# Patient Record
Sex: Female | Born: 1995
Health system: Southern US, Community
[De-identification: ages and names within clinical notes are randomized; demographics above are authoritative.]

## PROBLEM LIST (undated history)

## (undated) DIAGNOSIS — Z789 Other specified health status: Secondary | ICD-10-CM

## (undated) HISTORY — DX: Other specified health status: Z78.9

---
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
## 2018-03-02 ENCOUNTER — Other Ambulatory Visit: Payer: Self-pay | Admitting: Gastroenterology

## 2018-03-07 ENCOUNTER — Ambulatory Visit (HOSPITAL_COMMUNITY)
Admission: RE | Admit: 2018-03-07 | Discharge: 2018-03-07 | Disposition: A | Discharge status: Home / Self Care | Payer: BLUE CROSS/BLUE SHIELD | Source: Ambulatory Visit | Attending: Gastroenterology | Admitting: Gastroenterology

## 2018-03-07 ENCOUNTER — Encounter (HOSPITAL_COMMUNITY)
Admission: RE | Disposition: A | Discharge status: Home / Self Care | Payer: Self-pay | Source: Ambulatory Visit | Attending: Gastroenterology

## 2018-03-07 ENCOUNTER — Encounter (HOSPITAL_COMMUNITY): Payer: Self-pay | Admitting: *Deleted

## 2018-03-07 DIAGNOSIS — R1310 Dysphagia, unspecified: Secondary | ICD-10-CM | POA: Insufficient documentation

## 2018-03-07 DIAGNOSIS — K219 Gastro-esophageal reflux disease without esophagitis: Secondary | ICD-10-CM | POA: Insufficient documentation

## 2018-03-07 HISTORY — PX: ESOPHAGEAL MANOMETRY: SHX5429

## 2018-03-07 SURGERY — MANOMETRY, ESOPHAGUS

## 2018-03-07 MED ORDER — LIDOCAINE VISCOUS 2 % MT SOLN
OROMUCOSAL | Status: AC
  Filled 2018-03-07: qty 15

## 2018-03-07 SURGICAL SUPPLY — 2 items
FACESHIELD LNG OPTICON STERILE (SAFETY) IMPLANT
GLOVE BIO SURGEON STRL SZ8 (GLOVE) ×4 IMPLANT

## 2018-03-07 NOTE — H&P (Signed)
  Susan Maxcyaliyah K Lara HPI: She complaints about persistent dysphagia with a negative EGD and no response with Dexilant.  No past medical history on file.    No family history on file.  Social History:  has no tobacco, alcohol, and drug history on file.  Allergies: Not on File  Medications: Scheduled: Continuous:  No results found for this or any previous visit (from the past 24 hour(s)).   No results found.  ROS:  As stated above in the HPI otherwise negative.  Height 5\' 2"  (1.575 m), weight 40.8 kg (90 lb).    PE: Nursing procedure.  Assessment/Plan: 1) Dysphagia - Manometry  Malya Cirillo D 03/07/2018, 5:42 PM

## 2018-03-07 NOTE — Progress Notes (Signed)
Esophageal manometry done per protocol.  Patient tolerated well.  

## 2018-06-29 ENCOUNTER — Telehealth: Payer: Self-pay

## 2018-06-29 NOTE — Telephone Encounter (Signed)
error 

## 2018-10-14 ENCOUNTER — Ambulatory Visit (HOSPITAL_COMMUNITY): Payer: Self-pay | Admitting: Psychiatry

## 2018-10-26 ENCOUNTER — Ambulatory Visit (HOSPITAL_COMMUNITY): Payer: Self-pay | Admitting: Psychiatry

## 2018-11-27 ENCOUNTER — Encounter (HOSPITAL_COMMUNITY): Payer: Self-pay | Admitting: Emergency Medicine

## 2018-11-27 ENCOUNTER — Emergency Department (HOSPITAL_COMMUNITY)
Admission: EM | Admit: 2018-11-27 | Discharge: 2018-11-27 | Disposition: A | Discharge status: Home / Self Care | Payer: BLUE CROSS/BLUE SHIELD | Attending: Emergency Medicine | Admitting: Emergency Medicine

## 2018-11-27 DIAGNOSIS — R110 Nausea: Secondary | ICD-10-CM | POA: Insufficient documentation

## 2018-11-27 DIAGNOSIS — R0602 Shortness of breath: Secondary | ICD-10-CM | POA: Insufficient documentation

## 2018-11-27 DIAGNOSIS — R42 Dizziness and giddiness: Secondary | ICD-10-CM | POA: Diagnosis not present

## 2018-11-27 DIAGNOSIS — R519 Headache, unspecified: Secondary | ICD-10-CM

## 2018-11-27 DIAGNOSIS — M791 Myalgia, unspecified site: Secondary | ICD-10-CM | POA: Insufficient documentation

## 2018-11-27 DIAGNOSIS — Z79899 Other long term (current) drug therapy: Secondary | ICD-10-CM | POA: Diagnosis not present

## 2018-11-27 DIAGNOSIS — R51 Headache: Secondary | ICD-10-CM | POA: Insufficient documentation

## 2018-11-27 LAB — COMPREHENSIVE METABOLIC PANEL
ALT: 10 U/L (ref 0–44)
AST: 21 U/L (ref 15–41)
Albumin: 4.5 g/dL (ref 3.5–5.0)
Alkaline Phosphatase: 61 U/L (ref 38–126)
Anion gap: 12 (ref 5–15)
BUN: 17 mg/dL (ref 6–20)
CO2: 24 mmol/L (ref 22–32)
Calcium: 9.3 mg/dL (ref 8.9–10.3)
Chloride: 106 mmol/L (ref 98–111)
Creatinine, Ser: 0.84 mg/dL (ref 0.44–1.00)
GFR calc Af Amer: >60 mL/min (ref >60)
GFR calc non Af Amer: >60 mL/min (ref >60)
Glucose, Bld: 87 mg/dL (ref 70–99)
Potassium: 3.4 mmol/L — ABNORMAL LOW (ref 3.5–5.1)
Sodium: 142 mmol/L (ref 135–145)
Total Bilirubin: 2.7 mg/dL — ABNORMAL HIGH (ref 0.3–1.2)
Total Protein: 7.6 g/dL (ref 6.5–8.1)

## 2018-11-27 LAB — CBC WITH DIFFERENTIAL/PLATELET
Abs Immature Granulocytes: 0.02 10*3/uL (ref 0.00–0.07)
Basophils Absolute: 0.1 10*3/uL (ref 0.0–0.1)
Basophils Relative: 1 %
Eosinophils Absolute: 0.1 10*3/uL (ref 0.0–0.5)
Eosinophils Relative: 1 %
HCT: 43.7 % (ref 36.0–46.0)
Hemoglobin: 13.7 g/dL (ref 12.0–15.0)
Immature Granulocytes: 0 %
Lymphocytes Relative: 21 %
Lymphs Abs: 1.4 10*3/uL (ref 0.7–4.0)
MCH: 28.4 pg (ref 26.0–34.0)
MCHC: 31.4 g/dL (ref 30.0–36.0)
MCV: 90.5 fL (ref 80.0–100.0)
Monocytes Absolute: 0.5 10*3/uL (ref 0.1–1.0)
Monocytes Relative: 7 %
Neutro Abs: 4.6 10*3/uL (ref 1.7–7.7)
Neutrophils Relative %: 70 %
Platelets: 265 10*3/uL (ref 150–400)
RBC: 4.83 MIL/uL (ref 3.87–5.11)
RDW: 13.2 % (ref 11.5–15.5)
WBC: 6.6 10*3/uL (ref 4.0–10.5)
nRBC: 0 % (ref 0.0–0.2)

## 2018-11-27 LAB — RAPID URINE DRUG SCREEN, HOSP PERFORMED
Amphetamines: NOT DETECTED
Barbiturates: NOT DETECTED
Benzodiazepines: NOT DETECTED
Cocaine: NOT DETECTED
Opiates: NOT DETECTED
Tetrahydrocannabinol: NOT DETECTED

## 2018-11-27 LAB — CK: Total CK: 110 U/L (ref 38–234)

## 2018-11-27 LAB — TSH: TSH: 1.101 u[IU]/mL (ref 0.350–4.500)

## 2018-11-27 LAB — POC URINE PREG, ED: Preg Test, Ur: NEGATIVE

## 2018-11-27 MED ORDER — POTASSIUM CHLORIDE CRYS ER 20 MEQ PO TBCR
40.0000 meq | EXTENDED_RELEASE_TABLET | Freq: Once | ORAL | Status: AC
  Administered 2018-11-27: 40 meq via ORAL
  Filled 2018-11-27: qty 2

## 2018-11-27 MED ORDER — SODIUM CHLORIDE 0.9 % IV BOLUS
1000.0000 mL | Freq: Once | INTRAVENOUS | Status: AC
  Administered 2018-11-27: 1000 mL via INTRAVENOUS

## 2018-11-27 NOTE — ED Triage Notes (Signed)
Pt c/o headaches, body aches, dizziness for couple weeks. Denies n/v but reports light sensitivity.

## 2018-11-27 NOTE — Discharge Instructions (Addendum)
If you develop worsening headache, neck stiffness, fever, vomiting, double vision or consistent blurry vision, weakness or numbness in your arms or legs, or any other new/concerning symptoms then return to the ER for evaluation.
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
## 2018-11-27 NOTE — ED Provider Notes (Signed)
[facility]-EMERGENCY DEPT Provider Note   CSN: [id number] Arrival date & time: 11/27/18  4098     History   Chief Complaint Chief Complaint  Patient presents with  . Headache  . Dizziness  . Generalized Body Aches    HPI Susan Lara is a 22 y.o. female.  HPI  22 year old female presents with multiple complaints.  For about a week or so she has been having headaches.  Seem to come and go.  Often they are present in the morning and then progressively worsen.  Seem to get better and then come back at night and she has a hard time sleeping.  She has not had any vomiting but some nausea.  She is also been having some body aches that are mostly in her bilateral legs.  She states that she does have some blurry vision when she stares at something for too long.  However generally is not having any blurry vision or double vision.  The headaches are general throughout her entire head and seem to be throbbing.  Currently the headache is pretty mild.  She is also been having dizziness/lightheadedness for a couple weeks.  Saw her doctor about a week ago and her blood pressure was 70 systolic.  She feels overall weak, especially first thing in the morning.  Her last menstrual cycle was about 2 weeks ago.  There is no diarrhea, chest pain, cough.  She feels a little short of breath at times though nothing sets it off.  She thinks she might of lost a little bit of weight recently but that is a fluctuating problem for her overall.  History reviewed. No pertinent past medical history.  There are no active problems to display for this patient.   Past Surgical History:  Procedure Laterality Date  . ESOPHAGEAL MANOMETRY N/A 03/07/2018   Procedure: ESOPHAGEAL MANOMETRY (EM);  Surgeon: Jeani Hawking, MD;  Location: [facility];  Service: Endoscopy;  Laterality: N/A;  . SINUS EXPLORATION  2011     OB History   No obstetric history on file.      Home Medications    Prior  to Admission medications   Medication Sig Start Date End Date Taking? Authorizing Provider  ARIPiprazole (ABILIFY) 10 MG tablet Take 10 mg by mouth at bedtime.   Yes [provider]    Family History No family history on file.  Social History Social History   Tobacco Use  . Smoking status: Never Smoker  . Smokeless tobacco: Never Used  Substance Use Topics  . Alcohol use: Not Currently  . Drug use: Not on file     Allergies   Patient has no known allergies.   Review of Systems Review of Systems  Constitutional: Negative for fever.  Eyes: Positive for visual disturbance.  Respiratory: Positive for shortness of breath. Negative for cough.   Cardiovascular: Negative for chest pain.  Gastrointestinal: Positive for nausea. Negative for abdominal pain, diarrhea and vomiting.  Musculoskeletal: Negative for neck pain.  Neurological: Positive for dizziness, light-headedness and headaches. Negative for weakness and numbness.  All other systems reviewed and are negative.    Physical Exam Updated Vital Signs BP 97/70 (BP Location: Left Arm)   Pulse 87   Temp 98 F (36.7 C)   Resp 18   LMP 11/13/2018   SpO2 100%   Physical Exam Vitals signs and nursing note reviewed.  Constitutional:      Appearance: She is well-developed.  HENT:  Head: Normocephalic and atraumatic.     Right Ear: External ear normal.     Left Ear: External ear normal.     Nose: Nose normal.  Eyes:     General:        Right eye: No discharge.        Left eye: No discharge.     Extraocular Movements: Extraocular movements intact.     Pupils: Pupils are equal, round, and reactive to light.  Neck:     Musculoskeletal: Normal range of motion and neck supple.  Cardiovascular:     Rate and Rhythm: Normal rate and regular rhythm.     Heart sounds: Normal heart sounds.  Pulmonary:     Effort: Pulmonary effort is normal.     Breath sounds: Normal breath sounds.  Abdominal:     Palpations:  Abdomen is soft.     Tenderness: There is no abdominal tenderness.  Skin:    General: Skin is warm and dry.  Neurological:     Mental Status: She is alert and oriented to person, place, and time.     Comments: CN 3-12 grossly intact. 5/5 strength in all 4 extremities. Grossly normal sensation. Normal finger to nose.   Psychiatric:        Mood and Affect: Mood is not anxious.      ED Treatments / Results  Labs (all labs ordered are listed, but only abnormal results are displayed) Labs Reviewed  COMPREHENSIVE METABOLIC PANEL - Abnormal; Notable for the following components:      Result Value   Potassium 3.4 (*)    Total Bilirubin 2.7 (*)    All other components within normal limits  CBC WITH DIFFERENTIAL/PLATELET  TSH  CK  RAPID URINE DRUG SCREEN, HOSP PERFORMED  POC URINE PREG, ED    EKG EKG Interpretation  Date/Time:  Sunday November 27 2018 09:54:08 EST Ventricular Rate:  74 PR Interval:    QRS Duration: 88 QT Interval:  390 QTC Calculation: 433 R Axis:   91 Text Interpretation:  Sinus rhythm Borderline right axis deviation no acute ST/T changes No old tracing to compare Confirmed by Pricilla LovelessGoldston, Quindon Denker [phone]) on 11/27/2018 10:22:04 AM Also confirmed by Pricilla LovelessGoldston, Valena Ivanov [phone]), editor Barbette Hairassel, Kerry [phone])  on 11/27/2018 12:28:22 PM   Radiology No results found.  Procedures Procedures (including critical care time)  Medications Ordered in ED Medications  sodium chloride 0.9 % bolus 1,000 mL (0 mLs Intravenous Stopped 11/27/18 1155)  potassium chloride SA (K-DUR,KLOR-CON) CR tablet 40 mEq (40 mEq Oral Given 11/27/18 1152)     Initial Impression / Assessment and Plan / ED Course  I have reviewed the triage vital signs and the nursing notes.  Pertinent labs & imaging results that were available during my care of the patient were reviewed by me and considered in my medical decision making (see chart for details).     Patient with multiple vague complaints.   While headache is 1 of them, she has a benign neurologic exam.  She does endorse some blurry vision but has 20/20 vision bilaterally.  There is no double vision.  Headache is sometimes worse in the morning but also worse at night and there is no emesis in the a.m.  I doubt CNS mass, infection, etc.  I do not think an emergent CT is warranted though she may need an outpatient MRI if the symptoms continue.  Given the low blood pressure at her doctor's office when this first started she may be  having low blood pressure causing symptoms such as dizziness and headache.  I have advised her to check her blood pressure when the symptoms occur.  Right now she is feeling fine.  Otherwise screening lab work is pretty benign besides minimal hypokalemia.  The mildly elevated bilirubin is of unclear etiology as she has no abdominal pain but I think overall she is stable for discharge to follow-up with her PCP.  We discussed return precautions.  Final Clinical Impressions(s) / ED Diagnoses   Final diagnoses:  Aching headache    ED Discharge Orders    None       Pricilla LovelessGoldston, Jadesola Poynter, MD 11/27/18 1529

## 2019-11-22 ENCOUNTER — Other Ambulatory Visit: Payer: Self-pay

## 2019-11-22 ENCOUNTER — Ambulatory Visit (INDEPENDENT_AMBULATORY_CARE_PROVIDER_SITE_OTHER): Payer: BC Managed Care – PPO | Admitting: Adult Health

## 2019-11-22 ENCOUNTER — Encounter: Payer: Self-pay | Admitting: Adult Health

## 2019-11-22 VITALS — BP 97/61 | HR 89 | Ht 63.0 in | Wt 104.8 lb

## 2019-11-22 DIAGNOSIS — Z3A01 Less than 8 weeks gestation of pregnancy: Secondary | ICD-10-CM | POA: Insufficient documentation

## 2019-11-22 DIAGNOSIS — O3680X0 Pregnancy with inconclusive fetal viability, not applicable or unspecified: Secondary | ICD-10-CM | POA: Diagnosis not present

## 2019-11-22 DIAGNOSIS — R350 Frequency of micturition: Secondary | ICD-10-CM | POA: Insufficient documentation

## 2019-11-22 DIAGNOSIS — Z3201 Encounter for pregnancy test, result positive: Secondary | ICD-10-CM | POA: Insufficient documentation

## 2019-11-22 LAB — POCT URINALYSIS DIPSTICK OB
Blood, UA: NEGATIVE
Glucose, UA: NEGATIVE
Nitrite, UA: NEGATIVE

## 2019-11-22 LAB — POCT URINE PREGNANCY: Preg Test, Ur: POSITIVE — AB

## 2019-11-22 MED ORDER — PRENATAL PLUS IRON 29-1 MG PO TABS: 1.0000 | ORAL_TABLET | Freq: Every day | ORAL | 12 refills | Status: DC

## 2019-11-22 NOTE — Progress Notes (Signed)
  Subjective:     Patient ID: Susan Lara, female   DOB: March 24, 1996, 23 y.o.   MRN: [id number]  HPI Susan Lara is a 23 year old black female, single, new to this practice in for a UPT.Has missed a period and had 1+HPT. Had thought about being seen in [place] but they could not see til January. She was delivered by [facility]   Review of Systems +missed period +HPT +urinary frequency and cramping Denies any bleeding +sleepy     Objective:   Physical Exam BP 97/61 (BP Location: Left Arm, Patient Position: Sitting, Cuff Size: Normal)   Pulse 89   Ht 5\' 3"  (1.6 m)   Wt 104 lb 12.8 oz (47.5 kg)   LMP 10/17/2019 (Exact Date)   BMI 18.56 kg/m urine dipstick, trace protein,leuks and moderate ketones UPT is +, about 5+1 week by LMP with EDD 07/23/20.   Skin warm and dry. Neck: mid line trachea, normal thyroid, good ROM, no lymphadenopathy noted. Lungs: clear to ausculation bilaterally. Cardiovascular: regular rate and rhythm. Abdomen is soft and non tender Fall risk is low PHQ 2 score is 0.  Pt aware deliveries at [facility] and about after hours call service.  Discussed early symptoms of pregnancy     Assessment:     1. Positive urine pregnancy test   2. Frequent urination   3. Less than [redacted] weeks gestation of pregnancy   4. Encounter to determine fetal viability of pregnancy, single or unspecified fetus       Plan:     Meds ordered this encounter  Medications  . Prenatal Vit-Iron Carbonyl-FA (PRENATAL PLUS IRON) 29-1 MG TABS    Sig: Take 1 tablet by mouth daily.    Dispense:  30 tablet    Refill:  12    Order Specific Question:   Supervising Provider    Answer:   Florian Buff [[id number]]  Return in 3 weeks for dating [place] Review handouts on First trimester and by [facility]

## 2019-11-22 NOTE — Patient Instructions (Signed)
First Trimester of Pregnancy The first trimester of pregnancy is from week 1 until the end of week 13 (months 1 through 3). A week after a sperm fertilizes an egg, the egg will implant on the wall of the uterus. This embryo will begin to develop into a baby. Genes from you and your partner will form the baby. The female genes will determine whether the baby will be a boy or a girl. At 6-8 weeks, the eyes and face will be formed, and the heartbeat can be seen on ultrasound. At the end of 12 weeks, all the baby's organs will be formed. Now that you are pregnant, you will want to do everything you can to have a healthy baby. Two of the most important things are to get good prenatal care and to follow your health care provider's instructions. Prenatal care is all the medical care you receive before the baby's birth. This care will help prevent, find, and treat any problems during the pregnancy and childbirth. Body changes during your first trimester Your body goes through many changes during pregnancy. The changes vary from woman to woman.  You may gain or lose a couple of pounds at first.  You may feel sick to your stomach (nauseous) and you may throw up (vomit). If the vomiting is uncontrollable, call your health care provider.  You may tire easily.  You may develop headaches that can be relieved by medicines. All medicines should be approved by your health care provider.  You may urinate more often. Painful urination may mean you have a bladder infection.  You may develop heartburn as a result of your pregnancy.  You may develop constipation because certain hormones are causing the muscles that push stool through your intestines to slow down.  You may develop hemorrhoids or swollen veins (varicose veins).  Your breasts may begin to grow larger and become tender. Your nipples may stick out more, and the tissue that surrounds them (areola) may become darker.  Your gums may bleed and may be  sensitive to brushing and flossing.  Dark spots or blotches (chloasma, mask of pregnancy) may develop on your face. This will likely fade after the baby is born.  Your menstrual periods will stop.  You may have a loss of appetite.  You may develop cravings for certain kinds of food.  You may have changes in your emotions from day to day, such as being excited to be pregnant or being concerned that something may go wrong with the pregnancy and baby.  You may have more vivid and strange dreams.  You may have changes in your hair. These can include thickening of your hair, rapid growth, and changes in texture. Some women also have hair loss during or after pregnancy, or hair that feels dry or thin. Your hair will most likely return to normal after your baby is born. What to expect at prenatal visits During a routine prenatal visit:  You will be weighed to make sure you and the baby are growing normally.  Your blood pressure will be taken.  Your abdomen will be measured to track your baby's growth.  The fetal heartbeat will be listened to between weeks 10 and 14 of your pregnancy.  Test results from any previous visits will be discussed. Your health care provider may ask you:  How you are feeling.  If you are feeling the baby move.  If you have had any abnormal symptoms, such as leaking fluid, bleeding, severe headaches, or abdominal   cramping.  If you are using any tobacco products, including cigarettes, chewing tobacco, and electronic cigarettes.  If you have any questions. Other tests that may be performed during your first trimester include:  Blood tests to find your blood type and to check for the presence of any previous infections. The tests will also be used to check for low iron levels (anemia) and protein on red blood cells (Rh antibodies). Depending on your risk factors, or if you previously had diabetes during pregnancy, you may have tests to check for high blood sugar  that affects pregnant women (gestational diabetes).  Urine tests to check for infections, diabetes, or protein in the urine.  An ultrasound to confirm the proper growth and development of the baby.  Fetal screens for spinal cord problems (spina bifida) and Down syndrome.  HIV (human immunodeficiency virus) testing. Routine prenatal testing includes screening for HIV, unless you choose not to have this test.  You may need other tests to make sure you and the baby are doing well. Follow these instructions at home: Medicines  Follow your health care provider's instructions regarding medicine use. Specific medicines may be either safe or unsafe to take during pregnancy.  Take a prenatal vitamin that contains at least 600 micrograms (mcg) of folic acid.  If you develop constipation, try taking a stool softener if your health care provider approves. Eating and drinking   Eat a balanced diet that includes fresh fruits and vegetables, whole grains, good sources of protein such as meat, eggs, or tofu, and low-fat dairy. Your health care provider will help you determine the amount of weight gain that is right for you.  Avoid raw meat and uncooked cheese. These carry germs that can cause birth defects in the baby.  Eating four or five small meals rather than three large meals a day may help relieve nausea and vomiting. If you start to feel nauseous, eating a few soda crackers can be helpful. Drinking liquids between meals, instead of during meals, also seems to help ease nausea and vomiting.  Limit foods that are high in fat and processed sugars, such as fried and sweet foods.  To prevent constipation: ? Eat foods that are high in fiber, such as fresh fruits and vegetables, whole grains, and beans. ? Drink enough fluid to keep your urine clear or pale yellow. Activity  Exercise only as directed by your health care provider. Most women can continue their usual exercise routine during  pregnancy. Try to exercise for 30 minutes at least 5 days a week. Exercising will help you: ? Control your weight. ? Stay in shape. ? Be prepared for labor and delivery.  Experiencing pain or cramping in the lower abdomen or lower back is a good sign that you should stop exercising. Check with your health care provider before continuing with normal exercises.  Try to avoid standing for long periods of time. Move your legs often if you must stand in one place for a long time.  Avoid heavy lifting.  Wear low-heeled shoes and practice good posture.  You may continue to have sex unless your health care provider tells you not to. Relieving pain and discomfort  Wear a good support bra to relieve breast tenderness.  Take warm sitz baths to soothe any pain or discomfort caused by hemorrhoids. Use hemorrhoid cream if your health care provider approves.  Rest with your legs elevated if you have leg cramps or low back pain.  If you develop varicose veins in   your legs, wear support hose. Elevate your feet for 15 minutes, 3-4 times a day. Limit salt in your diet. Prenatal care  Schedule your prenatal visits by the twelfth week of pregnancy. They are usually scheduled monthly at first, then more often in the last 2 months before delivery.  Write down your questions. Take them to your prenatal visits.  Keep all your prenatal visits as told by your health care provider. This is important. Safety  Wear your seat belt at all times when driving.  Make a list of emergency phone numbers, including numbers for family, friends, the hospital, and police and fire departments. General instructions  Ask your health care provider for a referral to a local prenatal education class. Begin classes no later than the beginning of month 6 of your pregnancy.  Ask for help if you have counseling or nutritional needs during pregnancy. Your health care provider can offer advice or refer you to specialists for help  with various needs.  Do not use hot tubs, steam rooms, or saunas.  Do not douche or use tampons or scented sanitary pads.  Do not cross your legs for long periods of time.  Avoid cat litter boxes and soil used by cats. These carry germs that can cause birth defects in the baby and possibly loss of the fetus by miscarriage or stillbirth.  Avoid all smoking, herbs, alcohol, and medicines not prescribed by your health care provider. Chemicals in these products affect the formation and growth of the baby.  Do not use any products that contain nicotine or tobacco, such as cigarettes and e-cigarettes. If you need help quitting, ask your health care provider. You may receive counseling support and other resources to help you quit.  Schedule a dentist appointment. At home, brush your teeth with a soft toothbrush and be gentle when you floss. Contact a health care provider if:  You have dizziness.  You have mild pelvic cramps, pelvic pressure, or nagging pain in the abdominal area.  You have persistent nausea, vomiting, or diarrhea.  You have a bad smelling vaginal discharge.  You have pain when you urinate.  You notice increased swelling in your face, hands, legs, or ankles.  You are exposed to fifth disease or chickenpox.  You are exposed to German measles (rubella) and have never had it. Get help right away if:  You have a fever.  You are leaking fluid from your vagina.  You have spotting or bleeding from your vagina.  You have severe abdominal cramping or pain.  You have rapid weight gain or loss.  You vomit blood or material that looks like coffee grounds.  You develop a severe headache.  You have shortness of breath.  You have any kind of trauma, such as from a fall or a car accident. Summary  The first trimester of pregnancy is from week 1 until the end of week 13 (months 1 through 3).  Your body goes through many changes during pregnancy. The changes vary from  woman to woman.  You will have routine prenatal visits. During those visits, your health care provider will examine you, discuss any test results you may have, and talk with you about how you are feeling. This information is not intended to replace advice given to you by your health care provider. Make sure you discuss any questions you have with your health care provider. Document Released: 11/17/2001 Document Revised: 11/05/2017 Document Reviewed: 11/04/2016 Elsevier Patient Education  2020 [employer]  

## 2019-11-24 ENCOUNTER — Telehealth: Payer: Self-pay | Admitting: *Deleted

## 2019-11-24 NOTE — Telephone Encounter (Signed)
[name] from [facility]) called regarding prenatal prescription. Original prescription was for 29 mg of iron and they have the vitamin with 27 mg of iron. I advised 27 mg of iron is fine. Pt aware. [place]

## 2019-11-24 NOTE — Telephone Encounter (Signed)
Pt didn't think pharmacy had her prenatal vit but pt was advised prenatal plus was sent to her pharmacy on 11/22/19. Pt voiced understanding. [place]

## 2019-12-08 NOTE — L&D Delivery Note (Signed)
[facility] Delivery Note  Susan Lara is a 24 y.o. G1P1001 s/p NSVD at [redacted]w[redacted]d. She was admitted for SROM.   ROM: 17h 32m with clear fluid GBS Status: neg  Maximum Maternal Temperature: 98.3   Labor Progress: . Pt presented with SROM at 2000 last night and progressed after Pitocin augmentation to complete/+2. She labored down to 0 station for before attempting pushing.  Delivery Date/Time: 07/11/20 @ 1300 Delivery: Called to room and patient was complete and pushing. Head delivered LOA. No nuchal cord present. Shoulder and body delivered in usual fashion. Infant with spontaneous cry, placed on mother's abdomen, dried and stimulated. Cord clamped x 2 after delay, and cut by FOB Koleen Nimrod. Cord blood drawn. Placenta delivered spontaneously, intact, with 3-vessel cord. Fundus firm with massage and Pitocin. Labia, perineum, vagina, and cervix inspected, 2nd degree laceration found and repaired with 3.0 vicryl rapide.   Placenta: Intact Complications: None Lacerations: 2nd degree EBL: 75 Analgesia: Epidural  Postpartum Planning [x]  transfer orders to MB [x]  discharge summary started & shared [x]  message to sent to schedule follow-up  [x]  lists updated [x]  vaccines UTD  Infant: Girl  APGARs 9/9  2800g (6lb 2.8oz)  , CNM, IBCLC Certified Nurse Midwife, [facility] for , [facility] 07/11/2020, 2:10 PM

## 2019-12-13 ENCOUNTER — Ambulatory Visit (INDEPENDENT_AMBULATORY_CARE_PROVIDER_SITE_OTHER): Payer: Medicaid Other

## 2019-12-13 ENCOUNTER — Other Ambulatory Visit: Payer: Self-pay

## 2019-12-13 DIAGNOSIS — Z3A08 8 weeks gestation of pregnancy: Secondary | ICD-10-CM | POA: Diagnosis not present

## 2019-12-13 DIAGNOSIS — O3680X0 Pregnancy with inconclusive fetal viability, not applicable or unspecified: Secondary | ICD-10-CM

## 2019-12-13 NOTE — Progress Notes (Signed)
8+1 wks,single IUP w/ys,positive fht 144 bpm,normal ovaries,crl 14.57 mm

## 2019-12-17 ENCOUNTER — Telehealth: Payer: Self-pay | Admitting: Obstetrics and Gynecology

## 2019-12-17 MED ORDER — DOXYLAMINE-PYRIDOXINE 10-10 MG PO TBEC: 2.0000 | DELAYED_RELEASE_TABLET | Freq: Every day | ORAL | 3 refills | Status: DC

## 2019-12-17 NOTE — Telephone Encounter (Signed)
Susan Lara reports nausea and vomiting without dehydration that is becoming more intolerable. Tried BRAT diet yesterday, still vomiting. Will Rx Diclegis to [facility]. Pt aware.

## 2020-01-09 ENCOUNTER — Other Ambulatory Visit: Payer: Self-pay | Admitting: Obstetrics & Gynecology

## 2020-01-09 DIAGNOSIS — Z3682 Encounter for antenatal screening for nuchal translucency: Secondary | ICD-10-CM

## 2020-01-10 ENCOUNTER — Ambulatory Visit (INDEPENDENT_AMBULATORY_CARE_PROVIDER_SITE_OTHER): Payer: Medicaid Other | Admitting: Advanced Practice Midwife

## 2020-01-10 ENCOUNTER — Other Ambulatory Visit: Payer: Self-pay

## 2020-01-10 ENCOUNTER — Ambulatory Visit (INDEPENDENT_AMBULATORY_CARE_PROVIDER_SITE_OTHER): Payer: Medicaid Other

## 2020-01-10 ENCOUNTER — Ambulatory Visit: Payer: Medicaid Other | Admitting: *Deleted

## 2020-01-10 ENCOUNTER — Encounter: Payer: Self-pay | Admitting: Advanced Practice Midwife

## 2020-01-10 VITALS — BP 100/45 | HR 91 | Wt 99.0 lb

## 2020-01-10 DIAGNOSIS — Z1371 Encounter for nonprocreative screening for genetic disease carrier status: Secondary | ICD-10-CM

## 2020-01-10 DIAGNOSIS — Z331 Pregnant state, incidental: Secondary | ICD-10-CM

## 2020-01-10 DIAGNOSIS — Z3A12 12 weeks gestation of pregnancy: Secondary | ICD-10-CM

## 2020-01-10 DIAGNOSIS — Z1389 Encounter for screening for other disorder: Secondary | ICD-10-CM

## 2020-01-10 DIAGNOSIS — Z3400 Encounter for supervision of normal first pregnancy, unspecified trimester: Secondary | ICD-10-CM | POA: Insufficient documentation

## 2020-01-10 DIAGNOSIS — Z130 Encounter for screening for diseases of the blood and blood-forming organs and certain disorders involving the immune mechanism: Secondary | ICD-10-CM

## 2020-01-10 DIAGNOSIS — Z31430 Encounter of female for testing for genetic disease carrier status for procreative management: Secondary | ICD-10-CM

## 2020-01-10 DIAGNOSIS — Z3682 Encounter for antenatal screening for nuchal translucency: Secondary | ICD-10-CM | POA: Diagnosis not present

## 2020-01-10 DIAGNOSIS — Z3401 Encounter for supervision of normal first pregnancy, first trimester: Secondary | ICD-10-CM | POA: Diagnosis not present

## 2020-01-10 DIAGNOSIS — Z3491 Encounter for supervision of normal pregnancy, unspecified, first trimester: Secondary | ICD-10-CM

## 2020-01-10 DIAGNOSIS — Z113 Encounter for screening for infections with a predominantly sexual mode of transmission: Secondary | ICD-10-CM

## 2020-01-10 DIAGNOSIS — Z36 Encounter for antenatal screening for chromosomal anomalies: Secondary | ICD-10-CM

## 2020-01-10 DIAGNOSIS — Z0283 Encounter for blood-alcohol and blood-drug test: Secondary | ICD-10-CM

## 2020-01-10 DIAGNOSIS — Z124 Encounter for screening for malignant neoplasm of cervix: Secondary | ICD-10-CM

## 2020-01-10 MED ORDER — BLOOD PRESSURE MONITOR MISC: 0 refills | Status: DC

## 2020-01-10 NOTE — Progress Notes (Signed)
INITIAL OBSTETRICAL VISIT Patient name: Susan Lara MRN [id number]  Date of birth: Jan 27, 1996 Chief Complaint:   Initial Prenatal Visit  History of Present Illness:   Susan Lara is a 24 y.o. G1P0 African American female at [redacted]w[redacted]d by LMP c/w 8wk scan with an Estimated Date of Delivery: 07/23/20 being seen today for her initial obstetrical visit.   Her obstetrical history is significant for first pregnancy.   Today she reports nausea.  Patient's last menstrual period was 10/17/2019 (exact date). Last pap hasn't had one yet.  Review of Systems:   Pertinent items are noted in HPI Denies cramping/contractions, leakage of fluid, vaginal bleeding, abnormal vaginal discharge w/ itching/odor/irritation, headaches, visual changes, shortness of breath, chest pain, abdominal pain, severe nausea/vomiting, or problems with urination or bowel movements unless otherwise stated above.  Pertinent History Reviewed:  Reviewed past medical,surgical, social, obstetrical and family history.  Reviewed problem list, medications and allergies. OB History  Gravida Para Term Preterm AB Living  1            SAB TAB Ectopic Multiple Live Births               # Outcome Date GA Lbr Len/2nd Weight Sex Delivery Anes PTL Lv  1 Current            Physical Assessment:   Vitals:   01/10/20 0950  BP: (!) 100/45  Pulse: 91  Weight: 99 lb (44.9 kg)  Body mass index is 17.54 kg/m.       Physical Examination:  General appearance - well appearing, and in no distress  Mental status - alert, oriented to person, place, and time  Psych:  She has a normal mood and affect  Skin - warm and dry, normal color, no suspicious lesions noted  Chest - effort normal, all lung fields clear to auscultation bilaterally  Heart - normal rate and regular rhythm  Abdomen - soft, nontender  Extremities:  No swelling or varicosities noted  Pelvic - not indicated  Thin prep pap is not done per pt's request  TODAY'S NT [place]  12+1 wks,measurements c/w dates,crl 51.62 mm,fhr 146 bpm,NB present,NT 1.1 mm,normal ovaries,posterior placenta  No results found for this or any previous visit (from the past 24 hour(s)).  Assessment & Plan:  1) Low-Risk Pregnancy G1P0 at [redacted]w[redacted]d with an Estimated Date of Delivery: 07/23/20   2) Initial OB visit   Meds:  Meds ordered this encounter  Medications  . Blood Pressure Monitor MISC    Sig: For regular home bp monitoring during pregnancy    Dispense:  1 each    Refill:  0    Z34.01    Initial labs obtained Continue prenatal vitamins Reviewed n/v relief measures and warning s/s to report Reviewed recommended weight gain based on pre-gravid BMI Encouraged well-balanced diet Genetic Screening discussed: requested Cystic fibrosis, SMA, Fragile X screening discussed requested Ultrasound discussed; fetal survey: requested CCNC completed>PCM not here, form faxed The nature of  - [facility] [employer] with multiple MDs and other Advanced Practice Providers was explained to patient; also emphasized that fellows, residents, and students are part of our team. Ordered home bp cuff. Check bp weekly, let us know if >140/90.   No indications for ASA therapy or early GTT (per uptodate)  Follow-up: Return in about 6 weeks (around 02/21/2020) for LROB, [place]: Anatomy, in person, 2nd IT.   Orders Placed This Encounter  Procedures  . GC/Chlamydia Probe Amp  .  Urine Culture  . US OB Comp + 14 Wk  . Integrated 1  . Obstetric Panel, Including HIV  . Hemoglobinopathy evaluation  . MaterniT 21 plus Core, Blood  . Inheritest Core(CF97,SMA,FraX)  . Pain Management Screening Profile (10S)  . POC Urinalysis Dipstick OB    Myrtis Ser [facility] 01/10/2020 1:45 PM

## 2020-01-10 NOTE — Patient Instructions (Signed)
Susan Lara, I greatly value your feedback.  If you receive a survey following your visit with [place] today, we appreciate you taking the time to fill it out.  Thanks, Philipp Deputy CNM  [facility] HAS MOVED!!! It is now [facility] ([address] [place], [place] [address]) Entrance located off of [address] Free 24/7 valet parking   Nausea & Vomiting  Have saltine crackers or pretzels by your bed and eat a few bites before you raise your head out of bed in the morning  Eat small frequent meals throughout the day instead of large meals  Drink plenty of fluids throughout the day to stay hydrated, just don't drink a lot of fluids with your meals.  This can make your stomach fill up faster making you feel sick  Do not brush your teeth right after you eat  Products with real ginger are good for nausea, like ginger ale and ginger hard candy Make sure it says made with real ginger!  Sucking on sour candy like lemon heads is also good for nausea  If your prenatal vitamins make you nauseated, take them at night so you will sleep through the nausea  Sea Bands  If you feel like you need medicine for the nausea & vomiting please let us know  If you are unable to keep any fluids or food down please let us know   Constipation  Drink plenty of fluid, preferably water, throughout the day  Eat foods high in fiber such as fruits, vegetables, and grains  Exercise, such as walking, is a good way to keep your bowels regular  Drink warm fluids, especially warm prune juice, or decaf coffee  Eat a 1/2 cup of real oatmeal (not instant), 1/2 cup applesauce, and 1/2-1 cup warm prune juice every day  If needed, you may take Colace (docusate sodium) stool softener once or twice a day to help keep the stool soft.   If you still are having problems with constipation, you may take Miralax once daily as needed to help keep your bowels regular.   Home Blood Pressure  Monitoring for Patients   Your provider has recommended that you check your blood pressure (BP) at least once a week at home. If you do not have a blood pressure cuff at home, one will be provided for you. Contact your provider if you have not received your monitor within 1 week.   Helpful Tips for Accurate Home Blood Pressure Checks  . Don't smoke, exercise, or drink caffeine 30 minutes before checking your BP . Use the restroom before checking your BP (a full bladder can raise your pressure) . Relax in a comfortable upright chair . Feet on the ground . Left arm resting comfortably on a flat surface at the level of your heart . Legs uncrossed . Back supported . Sit quietly and don't talk . Place the cuff on your bare arm . Adjust snuggly, so that only two fingertips can fit between your skin and the top of the cuff . Check 2 readings separated by at least one minute . Keep a log of your BP readings . For a visual, please reference this diagram: http://ccnc.care/bpdiagram  Provider Name: [facility]     Phone: [phone]  Zone 1: ALL CLEAR  Continue to monitor your symptoms:  . BP reading is less than 140 (top number) or less than 90 (bottom number)  . No right upper stomach pain .  No headaches or seeing spots . No feeling nauseated or throwing up . No swelling in face and hands  Zone 2: CAUTION Call your doctor's office for any of the following:  . BP reading is greater than 140 (top number) or greater than 90 (bottom number)  . Stomach pain under your ribs in the middle or right side . Headaches or seeing spots . Feeling nauseated or throwing up . Swelling in face and hands  Zone 3: EMERGENCY  Seek immediate medical care if you have any of the following:  . BP reading is greater than160 (top number) or greater than 110 (bottom number) . Severe headaches not improving with Tylenol . Serious difficulty catching your breath . Any worsening symptoms from Zone 2     First Trimester of Pregnancy The first trimester of pregnancy is from week 1 until the end of week 12 (months 1 through 3). A week after a sperm fertilizes an egg, the egg will implant on the wall of the uterus. This embryo will begin to develop into a baby. Genes from you and your partner are forming the baby. The female genes determine whether the baby is a boy or a girl. At 6-8 weeks, the eyes and face are formed, and the heartbeat can be seen on ultrasound. At the end of 12 weeks, all the baby's organs are formed.  Now that you are pregnant, you will want to do everything you can to have a healthy baby. Two of the most important things are to get good prenatal care and to follow your health care provider's instructions. Prenatal care is all the medical care you receive before the baby's birth. This care will help prevent, find, and treat any problems during the pregnancy and childbirth. BODY CHANGES Your body goes through many changes during pregnancy. The changes vary from woman to woman.   You may gain or lose a couple of pounds at first.  You may feel sick to your stomach (nauseous) and throw up (vomit). If the vomiting is uncontrollable, call your health care provider.  You may tire easily.  You may develop headaches that can be relieved by medicines approved by your health care provider.  You may urinate more often. Painful urination may mean you have a bladder infection.  You may develop heartburn as a result of your pregnancy.  You may develop constipation because certain hormones are causing the muscles that push waste through your intestines to slow down.  You may develop hemorrhoids or swollen, bulging veins (varicose veins).  Your breasts may begin to grow larger and become tender. Your nipples may stick out more, and the tissue that surrounds them (areola) may become darker.  Your gums may bleed and may be sensitive to brushing and flossing.  Dark spots or blotches (chloasma,  mask of pregnancy) may develop on your face. This will likely fade after the baby is born.  Your menstrual periods will stop.  You may have a loss of appetite.  You may develop cravings for certain kinds of food.  You may have changes in your emotions from day to day, such as being excited to be pregnant or being concerned that something may go wrong with the pregnancy and baby.  You may have more vivid and strange dreams.  You may have changes in your hair. These can include thickening of your hair, rapid growth, and changes in texture. Some women also have hair loss during or after pregnancy, or hair that feels dry  or thin. Your hair will most likely return to normal after your baby is born. WHAT TO EXPECT AT YOUR PRENATAL VISITS During a routine prenatal visit:  You will be weighed to make sure you and the baby are growing normally.  Your blood pressure will be taken.  Your abdomen will be measured to track your baby's growth.  The fetal heartbeat will be listened to starting around week 10 or 12 of your pregnancy.  Test results from any previous visits will be discussed. Your health care provider may ask you:  How you are feeling.  If you are feeling the baby move.  If you have had any abnormal symptoms, such as leaking fluid, bleeding, severe headaches, or abdominal cramping.  If you have any questions. Other tests that may be performed during your first trimester include:  Blood tests to find your blood type and to check for the presence of any previous infections. They will also be used to check for low iron levels (anemia) and Rh antibodies. Later in the pregnancy, blood tests for diabetes will be done along with other tests if problems develop.  Urine tests to check for infections, diabetes, or protein in the urine.  An ultrasound to confirm the proper growth and development of the baby.  An amniocentesis to check for possible genetic problems.  Fetal screens for  spina bifida and Down syndrome.  You may need other tests to make sure you and the baby are doing well. HOME CARE INSTRUCTIONS  Medicines  Follow your health care provider's instructions regarding medicine use. Specific medicines may be either safe or unsafe to take during pregnancy.  Take your prenatal vitamins as directed.  If you develop constipation, try taking a stool softener if your health care provider approves. Diet  Eat regular, well-balanced meals. Choose a variety of foods, such as meat or vegetable-based protein, fish, milk and low-fat dairy products, vegetables, fruits, and whole grain breads and cereals. Your health care provider will help you determine the amount of weight gain that is right for you.  Avoid raw meat and uncooked cheese. These carry germs that can cause birth defects in the baby.  Eating four or five small meals rather than three large meals a day may help relieve nausea and vomiting. If you start to feel nauseous, eating a few soda crackers can be helpful. Drinking liquids between meals instead of during meals also seems to help nausea and vomiting.  If you develop constipation, eat more high-fiber foods, such as fresh vegetables or fruit and whole grains. Drink enough fluids to keep your urine clear or pale yellow. Activity and Exercise  Exercise only as directed by your health care provider. Exercising will help you:  Control your weight.  Stay in shape.  Be prepared for labor and delivery.  Experiencing pain or cramping in the lower abdomen or low back is a good sign that you should stop exercising. Check with your health care provider before continuing normal exercises.  Try to avoid standing for long periods of time. Move your legs often if you must stand in one place for a long time.  Avoid heavy lifting.  Wear low-heeled shoes, and practice good posture.  You may continue to have sex unless your health care provider directs you  otherwise. Relief of Pain or Discomfort  Wear a good support bra for breast tenderness.    Take warm sitz baths to soothe any pain or discomfort caused by hemorrhoids. Use hemorrhoid cream if your  health care provider approves.    Rest with your legs elevated if you have leg cramps or low back pain.  If you develop varicose veins in your legs, wear support hose. Elevate your feet for 15 minutes, 3-4 times a day. Limit salt in your diet. Prenatal Care  Schedule your prenatal visits by the twelfth week of pregnancy. They are usually scheduled monthly at first, then more often in the last 2 months before delivery.  Write down your questions. Take them to your prenatal visits.  Keep all your prenatal visits as directed by your health care provider. Safety  Wear your seat belt at all times when driving.  Make a list of emergency phone numbers, including numbers for family, friends, the hospital, and police and fire departments. General Tips  Ask your health care provider for a referral to a local prenatal education class. Begin classes no later than at the beginning of month 6 of your pregnancy.  Ask for help if you have counseling or nutritional needs during pregnancy. Your health care provider can offer advice or refer you to specialists for help with various needs.  Do not use hot tubs, steam rooms, or saunas.  Do not douche or use tampons or scented sanitary pads.  Do not cross your legs for long periods of time.  Avoid cat litter boxes and soil used by cats. These carry germs that can cause birth defects in the baby and possibly loss of the fetus by miscarriage or stillbirth.  Avoid all smoking, herbs, alcohol, and medicines not prescribed by your health care provider. Chemicals in these affect the formation and growth of the baby.  Schedule a dentist appointment. At home, brush your teeth with a soft toothbrush and be gentle when you floss. SEEK MEDICAL CARE IF:   You have  dizziness.  You have mild pelvic cramps, pelvic pressure, or nagging pain in the abdominal area.  You have persistent nausea, vomiting, or diarrhea.  You have a bad smelling vaginal discharge.  You have pain with urination.  You notice increased swelling in your face, hands, legs, or ankles. SEEK IMMEDIATE MEDICAL CARE IF:   You have a fever.  You are leaking fluid from your vagina.  You have spotting or bleeding from your vagina.  You have severe abdominal cramping or pain.  You have rapid weight gain or loss.  You vomit blood or material that looks like coffee grounds.  You are exposed to [place] measles and have never had them.  You are exposed to fifth disease or chickenpox.  You develop a severe headache.  You have shortness of breath.  You have any kind of trauma, such as from a fall or a car accident. Document Released: 11/17/2001 Document Revised: 04/09/2014 Document Reviewed: 10/03/2013 [facility] Patient Information 2015 [place], [place]. This information is not intended to replace advice given to you by your health care provider. Make sure you discuss any questions you have with your health care provider.  Coronavirus (COVID-19) Are you at risk?  Are you at risk for the Coronavirus (COVID-19)?  To be considered HIGH RISK for Coronavirus (COVID-19), you have to meet the following criteria:  . Traveled to [place], [place], [place], [place] or [place]; or in the [place] to [place], [place], [place], or [place]; and have fever, cough, and shortness of breath within the last 2 weeks of travel OR . Been in close contact with a person diagnosed with COVID-19 within the last 2 weeks and  have fever, cough, and shortness of breath . IF YOU DO NOT MEET THESE CRITERIA, YOU ARE CONSIDERED LOW RISK FOR COVID-19.  What to do if you are HIGH RISK for COVID-19?  Marland Kitchen If you are having a medical emergency, call 911. . Seek medical care right away. Before you go to a  doctor's office, urgent care or emergency department, call ahead and tell them about your recent travel, contact with someone diagnosed with COVID-19, and your symptoms. You should receive instructions from your physician's office regarding next steps of care.  . When you arrive at healthcare provider, tell the healthcare staff immediately you have returned from visiting [place], [place], [place], [place] or [place]; or traveled in the [place] to [place], [place], [place], or [place]; in the last two weeks or you have been in close contact with a person diagnosed with COVID-19 in the last 2 weeks.   . Tell the health care staff about your symptoms: fever, cough and shortness of breath. . After you have been seen by a medical provider, you will be either: o Tested for (COVID-19) and discharged home on quarantine except to seek medical care if symptoms worsen, and asked to  - Stay home and avoid contact with others until you get your results (4-5 days)  - Avoid travel on public transportation if possible (such as bus, train, or airplane) or o Sent to the Emergency Department by EMS for evaluation, COVID-19 testing, and possible admission depending on your condition and test results.  What to do if you are LOW RISK for COVID-19?  Reduce your risk of any infection by using the same precautions used for avoiding the common cold or flu:  Marland Kitchen Wash your hands often with soap and warm water for at least 20 seconds.  If soap and water are not readily available, use an alcohol-based hand sanitizer with at least 60% alcohol.  . If coughing or sneezing, cover your mouth and nose by coughing or sneezing into the elbow areas of your shirt or coat, into a tissue or into your sleeve (not your hands). . Avoid shaking hands with others and consider head nods or verbal greetings only. . Avoid touching your eyes, nose, or mouth with unwashed hands.  . Avoid close contact with people who are sick. . Avoid  places or events with large numbers of people in one location, like concerts or sporting events. . Carefully consider travel plans you have or are making. . If you are planning any travel outside or inside the [place], visit the CDC's Travelers' Health webpage for the latest health notices. . If you have some symptoms but not all symptoms, continue to monitor at home and seek medical attention if your symptoms worsen. . If you are having a medical emergency, call 911.   [place] / e-Visit: [email or web address]         [facility]: [facility]: [id number]                   [facility]: [phone]

## 2020-01-10 NOTE — Progress Notes (Signed)
[place] 12+1 wks,measurements c/w dates,crl 51.62 mm,fhr 146 bpm,NB present,NT 1.1 mm,normal ovaries,posterior placenta

## 2020-01-11 LAB — PMP SCREEN PROFILE (10S), URINE
Amphetamine Scrn, Ur: NEGATIVE ng/mL
BARBITURATE SCREEN URINE: NEGATIVE ng/mL
BENZODIAZEPINE SCREEN, URINE: NEGATIVE ng/mL
CANNABINOIDS UR QL SCN: NEGATIVE ng/mL
Cocaine (Metab) Scrn, Ur: NEGATIVE ng/mL
Creatinine(Crt), U: 306 mg/dL — ABNORMAL HIGH (ref 20.0–300.0)
Methadone Screen, Urine: NEGATIVE ng/mL
OXYCODONE+OXYMORPHONE UR QL SCN: NEGATIVE ng/mL
Opiate Scrn, Ur: NEGATIVE ng/mL
Ph of Urine: 6.1 (ref 4.5–8.9)
Phencyclidine Qn, Ur: NEGATIVE ng/mL
Propoxyphene Scrn, Ur: NEGATIVE ng/mL

## 2020-01-11 LAB — SPECIMEN STATUS REPORT

## 2020-01-12 LAB — URINE CULTURE: Organism ID, Bacteria: NO GROWTH

## 2020-01-12 LAB — SPECIMEN STATUS REPORT

## 2020-01-12 LAB — GC/CHLAMYDIA PROBE AMP
Chlamydia trachomatis, NAA: NEGATIVE
Neisseria Gonorrhoeae by PCR: NEGATIVE

## 2020-01-16 ENCOUNTER — Encounter: Payer: Self-pay | Admitting: *Deleted

## 2020-01-16 ENCOUNTER — Telehealth: Payer: Self-pay | Admitting: *Deleted

## 2020-01-16 NOTE — Telephone Encounter (Signed)
Patient left message wanting test results.  

## 2020-01-23 LAB — MATERNIT 21 PLUS CORE, BLOOD
Fetal Fraction: 11
Result (T21): NEGATIVE
Trisomy 13 (Patau syndrome): NEGATIVE
Trisomy 18 (Edwards syndrome): NEGATIVE
Trisomy 21 (Down syndrome): NEGATIVE

## 2020-01-23 LAB — INTEGRATED 1
Crown Rump Length: 51.6 mm
Gest. Age on Collection Date: 11.7 weeks
Maternal Age at EDD: 22.4 yr
Nuchal Translucency (NT): 1.1 mm
Number of Fetuses: 1
PAPP-A Value: 2976.7 ng/mL
Weight: 99 [lb_av]

## 2020-01-23 LAB — INHERITEST CORE(CF97,SMA,FRAX)

## 2020-01-23 LAB — OBSTETRIC PANEL, INCLUDING HIV
Antibody Screen: NEGATIVE
Basophils Absolute: 0 10*3/uL (ref 0.0–0.2)
Basos: 0 %
EOS (ABSOLUTE): 0.1 10*3/uL (ref 0.0–0.4)
Eos: 1 %
HIV Screen 4th Generation wRfx: NONREACTIVE
Hematocrit: 38.9 % (ref 34.0–46.6)
Hemoglobin: 12.8 g/dL (ref 11.1–15.9)
Hepatitis B Surface Ag: NEGATIVE
Immature Grans (Abs): 0.1 10*3/uL (ref 0.0–0.1)
Immature Granulocytes: 1 %
Lymphocytes Absolute: 1.4 10*3/uL (ref 0.7–3.1)
Lymphs: 14 %
MCH: 28.4 pg (ref 26.6–33.0)
MCHC: 32.9 g/dL (ref 31.5–35.7)
MCV: 86 fL (ref 79–97)
Monocytes Absolute: 0.6 10*3/uL (ref 0.1–0.9)
Monocytes: 6 %
Neutrophils Absolute: 7.7 10*3/uL — ABNORMAL HIGH (ref 1.4–7.0)
Neutrophils: 78 %
Platelets: 288 10*3/uL (ref 150–450)
RBC: 4.51 x10E6/uL (ref 3.77–5.28)
RDW: 12.3 % (ref 11.7–15.4)
RPR Ser Ql: NONREACTIVE
Rh Factor: POSITIVE
Rubella Antibodies, IGG: 16.8 index (ref >0.99)
WBC: 10 10*3/uL (ref 3.4–10.8)

## 2020-01-23 LAB — HEMOGLOBINOPATHY EVALUATION
HGB C: 0 %
HGB S: 0 %
HGB VARIANT: 0 %
Hemoglobin A2 Quantitation: 2.5 % (ref 1.8–3.2)
Hemoglobin F Quantitation: 0 % (ref 0.0–2.0)
Hgb A: 97.5 % (ref 96.4–98.8)

## 2020-02-20 ENCOUNTER — Other Ambulatory Visit: Payer: Self-pay | Admitting: Advanced Practice Midwife

## 2020-02-20 DIAGNOSIS — Z363 Encounter for antenatal screening for malformations: Secondary | ICD-10-CM

## 2020-02-20 DIAGNOSIS — Z3401 Encounter for supervision of normal first pregnancy, first trimester: Secondary | ICD-10-CM

## 2020-02-21 ENCOUNTER — Ambulatory Visit (INDEPENDENT_AMBULATORY_CARE_PROVIDER_SITE_OTHER): Payer: Medicaid Other | Admitting: Women's Health

## 2020-02-21 ENCOUNTER — Encounter: Payer: Self-pay | Admitting: Women's Health

## 2020-02-21 ENCOUNTER — Other Ambulatory Visit: Payer: Self-pay

## 2020-02-21 ENCOUNTER — Ambulatory Visit (INDEPENDENT_AMBULATORY_CARE_PROVIDER_SITE_OTHER): Payer: Medicaid Other

## 2020-02-21 VITALS — BP 80/50 | HR 78 | Wt 106.0 lb

## 2020-02-21 DIAGNOSIS — Z3A18 18 weeks gestation of pregnancy: Secondary | ICD-10-CM

## 2020-02-21 DIAGNOSIS — Z1389 Encounter for screening for other disorder: Secondary | ICD-10-CM

## 2020-02-21 DIAGNOSIS — Z3401 Encounter for supervision of normal first pregnancy, first trimester: Secondary | ICD-10-CM

## 2020-02-21 DIAGNOSIS — Z363 Encounter for antenatal screening for malformations: Secondary | ICD-10-CM

## 2020-02-21 DIAGNOSIS — Z3402 Encounter for supervision of normal first pregnancy, second trimester: Secondary | ICD-10-CM

## 2020-02-21 DIAGNOSIS — Z1379 Encounter for other screening for genetic and chromosomal anomalies: Secondary | ICD-10-CM

## 2020-02-21 DIAGNOSIS — Z331 Pregnant state, incidental: Secondary | ICD-10-CM

## 2020-02-21 LAB — POCT URINALYSIS DIPSTICK OB
Blood, UA: NEGATIVE
Glucose, UA: NEGATIVE
Ketones, UA: NEGATIVE
Leukocytes, UA: NEGATIVE
Nitrite, UA: NEGATIVE
POC,PROTEIN,UA: NEGATIVE

## 2020-02-21 NOTE — Progress Notes (Signed)
   LOW-RISK PREGNANCY VISIT Patient name: Susan Lara MRN [id number]  Date of birth: Dec 15, 1995 Chief Complaint:   Routine Prenatal Visit (U/S)  History of Present Illness:   Susan Lara is a 24 y.o. G1P0 female at [redacted]w[redacted]d with an Estimated Date of Delivery: 07/23/20 being seen today for ongoing management of a low-risk pregnancy.  Today she reports no complaints.  .  .  Movement: Absent. denies leaking of fluid. Review of Systems:   Pertinent items are noted in HPI Denies abnormal vaginal discharge w/ itching/odor/irritation, headaches, visual changes, shortness of breath, chest pain, abdominal pain, severe nausea/vomiting, or problems with urination or bowel movements unless otherwise stated above. Pertinent History Reviewed:  Reviewed past medical,surgical, social, obstetrical and family history.  Reviewed problem list, medications and allergies. Physical Assessment:   Vitals:   02/21/20 1023  BP: (!) 80/50  Pulse: 78  Weight: 106 lb (48.1 kg)  Body mass index is 18.78 kg/m.        Physical Examination:   General appearance: Well appearing, and in no distress  Mental status: Alert, oriented to person, place, and time  Skin: Warm & dry  Cardiovascular: Normal heart rate noted  Respiratory: Normal respiratory effort, no distress  Abdomen: Soft, gravid, nontender  Pelvic: Cervical exam deferred         Extremities: Edema: None  Fetal Status: Fetal Heart Rate (bpm): 133 u/s   Movement: Absent    [place] 18+1 wks,cephalic,posterior placenta gr 0,cx 3.2 cm,normal ovaries,svp of fluid 4.5 cm,fhr 133 bpm,anatomy complete,no obvious abnormalities   Chaperone: n/a    Results for orders placed or performed in visit on 02/21/20 (from the past 24 hour(s))  POC Urinalysis Dipstick OB   Collection Time: 02/21/20 10:30 AM  Result Value Ref Range   Color, UA     Clarity, UA     Glucose, UA Negative Negative   Bilirubin, UA     Ketones, UA neg    Spec Grav, UA     Blood, UA neg     pH, UA     POC,PROTEIN,UA Negative Negative, Trace, Small (1+), Moderate (2+), Large (3+), 4+   Urobilinogen, UA     Nitrite, UA neg    Leukocytes, UA Negative Negative   Appearance     Odor      Assessment & Plan:  1) Low-risk pregnancy G1P0 at [redacted]w[redacted]d with an Estimated Date of Delivery: 07/23/20    Meds: No orders of the defined types were placed in this encounter.  Labs/procedures today: anatomy u/s, 2nd IT, declined flu shot, does not want pap until pp  Plan:  Continue routine obstetrical care  Next visit: prefers online    Reviewed: Preterm labor symptoms and general obstetric precautions including but not limited to vaginal bleeding, contractions, leaking of fluid and fetal movement were reviewed in detail with the patient.  All questions were answered. Has home bp cuff. Check bp weekly, let us know if >140/90.   Follow-up: Return in about 4 weeks (around 03/20/2020) for LROB, CNM, MyChart Video.  Orders Placed This Encounter  Procedures  . INTEGRATED 2  . POC Urinalysis Dipstick OB   Cheral Marker CNM, [facility] 02/21/2020 11:06 AM

## 2020-02-21 NOTE — Patient Instructions (Signed)
Susan Lara, I greatly value your feedback.  If you receive a survey following your visit with [place] today, we appreciate you taking the time to fill it out.  Thanks, Joellyn Haff, CNM, [facility]  [facility] HAS MOVED!!! It is now [facility] ([address] [place], [place] [address]) Entrance located off of [address] Free 24/7 valet parking   Go to [email or web address] to register for FREE online childbirth classes  [place] Pediatricians/Family Doctors:  [facility] [phone]            [facility] [phone]                 [facility] [phone] (usually not accepting new patients unless you have family there already, you are always welcome to call and ask)       [facility] Department [phone]       [facility] Pediatricians/Family Doctors:   [facility]: [phone]  [facility]: [phone]  [facility]: [phone]  [facility] Doctors:   [facility]: [phone]   [facility]: [phone]  [facility] Doctors:  [facility]: [phone]    Home Blood Pressure Monitoring for Patients   Your provider has recommended that you check your blood pressure (BP) at least once a week at home. If you do not have a blood pressure cuff at home, one will be provided for you. Contact your provider if you have not received your monitor within 1 week.   Helpful Tips for Accurate Home Blood Pressure Checks  . Don't smoke, exercise, or drink caffeine 30 minutes before checking your BP . Use the restroom before checking your BP (a full bladder can raise your pressure) . Relax in a comfortable upright chair . Feet on the ground . Left arm resting comfortably on a flat surface at the level of your heart . Legs uncrossed . Back supported . Sit quietly and don't talk . Place the  cuff on your bare arm . Adjust snuggly, so that only two fingertips can fit between your skin and the top of the cuff . Check 2 readings separated by at least one minute . Keep a log of your BP readings . For a visual, please reference this diagram: http://ccnc.care/bpdiagram  Provider Name: [facility]     Phone: [phone]  Zone 1: ALL CLEAR  Continue to monitor your symptoms:  . BP reading is less than 140 (top number) or less than 90 (bottom number)  . No right upper stomach pain . No headaches or seeing spots . No feeling nauseated or throwing up . No swelling in face and hands  Zone 2: CAUTION Call your doctor's office for any of the following:  . BP reading is greater than 140 (top number) or greater than 90 (bottom number)  . Stomach pain under your ribs in the middle or right side . Headaches or seeing spots . Feeling nauseated or throwing up . Swelling in face and hands  Zone 3: EMERGENCY  Seek immediate medical care if you have any of the following:  . BP reading is greater than160 (top number) or greater than 110 (bottom number) . Severe headaches not improving with Tylenol . Serious difficulty catching your breath . Any worsening symptoms from Zone 2     Second Trimester of Pregnancy The second trimester is from week 14 through week 27 (months 4 through 6). The second trimester is  often a time when you feel your best. Your body has adjusted to being pregnant, and you begin to feel better physically. Usually, morning sickness has lessened or quit completely, you may have more energy, and you may have an increase in appetite. The second trimester is also a time when the fetus is growing rapidly. At the end of the sixth month, the fetus is about 9 inches long and weighs about 1 pounds. You will likely begin to feel the baby move (quickening) between 16 and 20 weeks of pregnancy. Body changes during your second trimester Your body continues to go through many  changes during your second trimester. The changes vary from woman to woman.  Your weight will continue to increase. You will notice your lower abdomen bulging out.  You may begin to get stretch marks on your hips, abdomen, and breasts.  You may develop headaches that can be relieved by medicines. The medicines should be approved by your health care provider.  You may urinate more often because the fetus is pressing on your bladder.  You may develop or continue to have heartburn as a result of your pregnancy.  You may develop constipation because certain hormones are causing the muscles that push waste through your intestines to slow down.  You may develop hemorrhoids or swollen, bulging veins (varicose veins).  You may have back pain. This is caused by: ? Weight gain. ? Pregnancy hormones that are relaxing the joints in your pelvis. ? A shift in weight and the muscles that support your balance.  Your breasts will continue to grow and they will continue to become tender.  Your gums may bleed and may be sensitive to brushing and flossing.  Dark spots or blotches (chloasma, mask of pregnancy) may develop on your face. This will likely fade after the baby is born.  A dark line from your belly button to the pubic area (linea nigra) may appear. This will likely fade after the baby is born.  You may have changes in your hair. These can include thickening of your hair, rapid growth, and changes in texture. Some women also have hair loss during or after pregnancy, or hair that feels dry or thin. Your hair will most likely return to normal after your baby is born.  What to expect at prenatal visits During a routine prenatal visit:  You will be weighed to make sure you and the fetus are growing normally.  Your blood pressure will be taken.  Your abdomen will be measured to track your baby's growth.  The fetal heartbeat will be listened to.  Any test results from the previous visit will  be discussed.  Your health care provider may ask you:  How you are feeling.  If you are feeling the baby move.  If you have had any abnormal symptoms, such as leaking fluid, bleeding, severe headaches, or abdominal cramping.  If you are using any tobacco products, including cigarettes, chewing tobacco, and electronic cigarettes.  If you have any questions.  Other tests that may be performed during your second trimester include:  Blood tests that check for: ? Low iron levels (anemia). ? High blood sugar that affects pregnant women (gestational diabetes) between 74 and 28 weeks. ? Rh antibodies. This is to check for a protein on red blood cells (Rh factor).  Urine tests to check for infections, diabetes, or protein in the urine.  An ultrasound to confirm the proper growth and development of the baby.  An amniocentesis to  check for possible genetic problems.  Fetal screens for spina bifida and Down syndrome.  HIV (human immunodeficiency virus) testing. Routine prenatal testing includes screening for HIV, unless you choose not to have this test.  Follow these instructions at home: Medicines  Follow your health care provider's instructions regarding medicine use. Specific medicines may be either safe or unsafe to take during pregnancy.  Take a prenatal vitamin that contains at least 600 micrograms (mcg) of folic acid.  If you develop constipation, try taking a stool softener if your health care provider approves. Eating and drinking  Eat a balanced diet that includes fresh fruits and vegetables, whole grains, good sources of protein such as meat, eggs, or tofu, and low-fat dairy. Your health care provider will help you determine the amount of weight gain that is right for you.  Avoid raw meat and uncooked cheese. These carry germs that can cause birth defects in the baby.  If you have low calcium intake from food, talk to your health care provider about whether you should take  a daily calcium supplement.  Limit foods that are high in fat and processed sugars, such as fried and sweet foods.  To prevent constipation: ? Drink enough fluid to keep your urine clear or pale yellow. ? Eat foods that are high in fiber, such as fresh fruits and vegetables, whole grains, and beans. Activity  Exercise only as directed by your health care provider. Most women can continue their usual exercise routine during pregnancy. Try to exercise for 30 minutes at least 5 days a week. Stop exercising if you experience uterine contractions.  Avoid heavy lifting, wear low heel shoes, and practice good posture.  A sexual relationship may be continued unless your health care provider directs you otherwise. Relieving pain and discomfort  Wear a good support bra to prevent discomfort from breast tenderness.  Take warm sitz baths to soothe any pain or discomfort caused by hemorrhoids. Use hemorrhoid cream if your health care provider approves.  Rest with your legs elevated if you have leg cramps or low back pain.  If you develop varicose veins, wear support hose. Elevate your feet for 15 minutes, 3-4 times a day. Limit salt in your diet. Prenatal Care  Write down your questions. Take them to your prenatal visits.  Keep all your prenatal visits as told by your health care provider. This is important. Safety  Wear your seat belt at all times when driving.  Make a list of emergency phone numbers, including numbers for family, friends, the hospital, and police and fire departments. General instructions  Ask your health care provider for a referral to a local prenatal education class. Begin classes no later than the beginning of month 6 of your pregnancy.  Ask for help if you have counseling or nutritional needs during pregnancy. Your health care provider can offer advice or refer you to specialists for help with various needs.  Do not use hot tubs, steam rooms, or saunas.  Do not  douche or use tampons or scented sanitary pads.  Do not cross your legs for long periods of time.  Avoid cat litter boxes and soil used by cats. These carry germs that can cause birth defects in the baby and possibly loss of the fetus by miscarriage or stillbirth.  Avoid all smoking, herbs, alcohol, and unprescribed drugs. Chemicals in these products can affect the formation and growth of the baby.  Do not use any products that contain nicotine or tobacco, such as  cigarettes and e-cigarettes. If you need help quitting, ask your health care provider.  Visit your dentist if you have not gone yet during your pregnancy. Use a soft toothbrush to brush your teeth and be gentle when you floss. Contact a health care provider if:  You have dizziness.  You have mild pelvic cramps, pelvic pressure, or nagging pain in the abdominal area.  You have persistent nausea, vomiting, or diarrhea.  You have a bad smelling vaginal discharge.  You have pain when you urinate. Get help right away if:  You have a fever.  You are leaking fluid from your vagina.  You have spotting or bleeding from your vagina.  You have severe abdominal cramping or pain.  You have rapid weight gain or weight loss.  You have shortness of breath with chest pain.  You notice sudden or extreme swelling of your face, hands, ankles, feet, or legs.  You have not felt your baby move in over an hour.  You have severe headaches that do not go away when you take medicine.  You have vision changes. Summary  The second trimester is from week 14 through week 27 (months 4 through 6). It is also a time when the fetus is growing rapidly.  Your body goes through many changes during pregnancy. The changes vary from woman to woman.  Avoid all smoking, herbs, alcohol, and unprescribed drugs. These chemicals affect the formation and growth your baby.  Do not use any tobacco products, such as cigarettes, chewing tobacco, and  e-cigarettes. If you need help quitting, ask your health care provider.  Contact your health care provider if you have any questions. Keep all prenatal visits as told by your health care provider. This is important. This information is not intended to replace advice given to you by your health care provider. Make sure you discuss any questions you have with your health care provider. Document Released: 11/17/2001 Document Revised: 04/30/2016 Document Reviewed: 01/24/2013 Elsevier Interactive Patient Education  2017 [place] FLU! Because you are pregnant, we at [facility], along with the Centers for Disease Control (CDC), recommend that you receive the flu vaccine to protect yourself and your baby from the flu. The flu is more likely to cause severe illness in pregnant women than in women of reproductive age who are not pregnant. Changes in the immune system, heart, and lungs during pregnancy make pregnant women (and women up to two weeks postpartum) more prone to severe illness from flu, including illness resulting in hospitalization. Flu also may be harmful for a pregnant woman's developing baby. A common flu symptom is fever, which may be associated with neural tube defects and other adverse outcomes for a developing baby. Getting vaccinated can also help protect a baby after birth from flu. (Mom passes antibodies onto the developing baby during her pregnancy.)  A Flu Vaccine is the Best Protection Against Flu Getting a flu vaccine is the first and most important step in protecting against flu. Pregnant women should get a flu shot and not the live attenuated influenza vaccine (LAIV), also known as nasal spray flu vaccine. Flu vaccines given during pregnancy help protect both the mother and her baby from flu. Vaccination has been shown to reduce the risk of flu-associated acute respiratory infection in pregnant women by up to one-half. A 2018 study showed  that getting a flu shot reduced a pregnant woman's risk of being hospitalized with flu by an average of  40 percent. Pregnant women who get a flu vaccine are also helping to protect their babies from flu illness for the first several months after their birth, when they are too young to get vaccinated.   A Long Record of Safety for Flu Shots in Pregnant Women Flu shots have been given to millions of pregnant women over many years with a good safety record. There is a lot of evidence that flu vaccines can be given safely during pregnancy; though these data are limited for the first trimester. The CDC recommends that pregnant women get vaccinated during any trimester of their pregnancy. It is very important for pregnant women to get the flu shot.   Other Preventive Actions In addition to getting a flu shot, pregnant women should take the same everyday preventive actions the CDC recommends of everyone, including covering coughs, washing hands often, and avoiding people who are sick.  Symptoms and Treatment If you get sick with flu symptoms call your doctor right away. There are antiviral drugs that can treat flu illness and prevent serious flu complications. The CDC recommends prompt treatment for people who have influenza infection or suspected influenza infection and who are at high risk of serious flu complications, such as people with asthma, diabetes (including gestational diabetes), or heart disease. Early treatment of influenza in hospitalized pregnant women has been shown to reduce the length of the hospital stay.  Symptoms Flu symptoms include fever, cough, sore throat, runny or stuffy nose, body aches, headache, chills and fatigue. Some people may also have vomiting and diarrhea. People may be infected with the flu and have respiratory symptoms without a fever.  Early Treatment is Important for Pregnant Women Treatment should begin as soon as possible because antiviral drugs work best when  started early (within 48 hours after symptoms start). Antiviral drugs can make your flu illness milder and make you feel better faster. They may also prevent serious health problems that can result from flu illness. Oral oseltamivir (Tamiflu) is the preferred treatment for pregnant women because it has the most studies available to suggest that it is safe and beneficial. Antiviral drugs require a prescription from your provider. Having a fever caused by flu infection or other infections early in pregnancy may be linked to birth defects in a baby. In addition to taking antiviral drugs, pregnant women who get a fever should treat their fever with Tylenol (acetaminophen) and contact their provider immediately.  When to [place] If you are pregnant and have any of these signs, seek care immediately:  Difficulty breathing or shortness of breath  Pain or pressure in the chest or abdomen  Sudden dizziness  Confusion  Severe or persistent vomiting  High fever that is not responding to Tylenol (or store brand equivalent)  Decreased or no movement of your baby  [email or web address]

## 2020-02-21 NOTE — Progress Notes (Signed)
[place] 18+1 wks,cephalic,posterior placenta gr 0,cx 3.2 cm,normal ovaries,svp of fluid 4.5 cm,fhr 133 bpm,anatomy complete,no obvious abnormalities

## 2020-02-23 LAB — INTEGRATED 2
AFP MoM: 1.18
Alpha-Fetoprotein: 64.2 ng/mL
Crown Rump Length: 51.6 mm
DIA MoM: 1.06
DIA Value: 227.4 pg/mL
Estriol, Unconjugated: 1.41 ng/mL
Gest. Age on Collection Date: 11.7 weeks
Gestational Age: 17.7 weeks
Maternal Age at EDD: 22.4 yr
Nuchal Translucency (NT): 1.1 mm
Nuchal Translucency MoM: 0.95
Number of Fetuses: 1
PAPP-A MoM: 2.66
PAPP-A Value: 2976.7 ng/mL
Test Results:: NEGATIVE
Weight: 99 [lb_av]
Weight: 99 [lb_av]
hCG MoM: 1.01
hCG Value: 32.3 IU/mL
uE3 MoM: 0.95

## 2020-03-04 ENCOUNTER — Telehealth: Payer: Self-pay | Admitting: Women's Health

## 2020-03-04 NOTE — Telephone Encounter (Signed)
Pt states that she is having a lot of stomach pain and a headache. Requesting to speak with a nurse.

## 2020-03-04 NOTE — Telephone Encounter (Signed)
Telephoned patient at home number. Patient states is having mild headache that Tylenol is not helping, stomach pains, and no vaginal bleeding. Patient would like appointment to make sure everything is fine. Patient scheduled.

## 2020-03-05 ENCOUNTER — Encounter: Payer: Medicaid Other | Admitting: Obstetrics & Gynecology

## 2020-03-20 ENCOUNTER — Encounter: Payer: Self-pay | Admitting: Advanced Practice Midwife

## 2020-03-20 ENCOUNTER — Telehealth (INDEPENDENT_AMBULATORY_CARE_PROVIDER_SITE_OTHER): Payer: Medicaid Other | Admitting: Advanced Practice Midwife

## 2020-03-20 ENCOUNTER — Other Ambulatory Visit: Payer: Self-pay

## 2020-03-20 VITALS — BP 82/57 | HR 97

## 2020-03-20 DIAGNOSIS — Z3402 Encounter for supervision of normal first pregnancy, second trimester: Secondary | ICD-10-CM

## 2020-03-20 NOTE — Progress Notes (Signed)
   TELEHEALTH VIRTUAL OBSTETRICS VISIT ENCOUNTER NOTE Patient name: Susan Lara MRN [id number]  Date of birth: 1996-08-15  I connected with patient on 03/20/20 at  3:30 PM EDT by MyChart and verified that I am speaking with the correct person using two identifiers. Due to COVID-19 recommendations, pt is not currently in our office.    I discussed the limitations, risks, security and privacy concerns of performing an evaluation and management service by telephone and the availability of in person appointments. I also discussed with the patient that there may be a patient responsible charge related to this service. The patient expressed understanding and agreed to proceed.  Chief Complaint:   Routine Prenatal Visit  History of Present Illness:   Susan Lara is a 24 y.o. G1P0 female at [redacted]w[redacted]d with an Estimated Date of Delivery: 07/23/20 being evaluated today for ongoing management of a low-risk pregnancy.  Depression screen [facility] 2/9 01/10/2020 11/22/2019  Decreased Interest 0 0  Down, Depressed, Hopeless 0 0  PHQ - 2 Score 0 0  Altered sleeping 0 -  Tired, decreased energy 0 -  Change in appetite 0 -  Feeling bad or failure about yourself  0 -  Trouble concentrating 0 -  Moving slowly or fidgety/restless 0 -  Suicidal thoughts 0 -  PHQ-9 Score 0 -    Today she reports no complaints.  . Vag. Bleeding: None.  Movement: Present. denies leaking of fluid. Review of Systems:   Pertinent items are noted in HPI Denies abnormal vaginal discharge w/ itching/odor/irritation, headaches, visual changes, shortness of breath, chest pain, abdominal pain, severe nausea/vomiting, or problems with urination or bowel movements unless otherwise stated above. Pertinent History Reviewed:  Reviewed past medical,surgical, social, obstetrical and family history.  Reviewed problem list, medications and allergies. Physical Assessment:   Vitals:   03/20/20 1529  BP: (!) 82/57  Pulse: 97  There is no  height or weight on file to calculate BMI.        Physical Examination:   General:  Alert, oriented and cooperative.   Mental Status: Normal mood and affect perceived. Normal judgment and thought content.  Rest of physical exam deferred due to type of encounter  No results found for this or any previous visit (from the past 24 hour(s)).  Assessment & Plan:  1) Pregnancy G1P0 at [redacted]w[redacted]d with an Estimated Date of Delivery: 07/23/20; reviewed in-person childbirth classes are now an option- to sign up on [employer]    Meds: No orders of the defined types were placed in this encounter.   Labs/procedures today: none  Plan:  Continue routine obstetrical care with PN2 at next visit.  Has home bp cuff.  Check bp weekly, let us know if >140/90.  Next visit: prefers will be in person for PN2    Reviewed: Preterm labor symptoms and general obstetric precautions including but not limited to vaginal bleeding, contractions, leaking of fluid and fetal movement were reviewed in detail with the patient. The patient was advised to call back or seek an in-person office evaluation/go to [facility] at [facility] for any urgent or concerning symptoms. All questions were answered. Please refer to After Visit Summary for other counseling recommendations.    I provided 10 minutes of non-face-to-face time during this encounter.  Follow-up: Return in about 4 weeks (around 04/17/2020) for LROB, in person, PN2.  No orders of the defined types were placed in this encounter.  Arabella Merles CNM 03/20/2020 3:46 PM

## 2020-03-21 ENCOUNTER — Telehealth: Payer: Self-pay | Admitting: Women's Health

## 2020-03-21 NOTE — Telephone Encounter (Signed)
Pt has allergies. I advised she can try Claritin during the day and Benadryl at night, drowsy precautions. Pt wonders if she can take Flonase. I advised yes. Pt voiced understanding. JSY

## 2020-03-21 NOTE — Telephone Encounter (Signed)
Pt would like to see what she can take for allergies while pregnant. Advised pt to check baby scripts. Pt requested a nurse to call her.

## 2020-04-17 ENCOUNTER — Other Ambulatory Visit: Payer: Medicaid Other

## 2020-04-17 ENCOUNTER — Encounter: Payer: Self-pay | Admitting: Women's Health

## 2020-04-17 ENCOUNTER — Other Ambulatory Visit: Payer: Self-pay

## 2020-04-17 ENCOUNTER — Ambulatory Visit (INDEPENDENT_AMBULATORY_CARE_PROVIDER_SITE_OTHER): Payer: Medicaid Other | Admitting: Women's Health

## 2020-04-17 VITALS — BP 83/53 | HR 70 | Wt 115.0 lb

## 2020-04-17 DIAGNOSIS — Z331 Pregnant state, incidental: Secondary | ICD-10-CM

## 2020-04-17 DIAGNOSIS — Z3482 Encounter for supervision of other normal pregnancy, second trimester: Secondary | ICD-10-CM

## 2020-04-17 DIAGNOSIS — Z131 Encounter for screening for diabetes mellitus: Secondary | ICD-10-CM

## 2020-04-17 DIAGNOSIS — Z3402 Encounter for supervision of normal first pregnancy, second trimester: Secondary | ICD-10-CM

## 2020-04-17 DIAGNOSIS — Z1389 Encounter for screening for other disorder: Secondary | ICD-10-CM

## 2020-04-17 NOTE — Progress Notes (Signed)
   LOW-RISK PREGNANCY VISIT Patient name: Susan Lara MRN [id number]  Date of birth: February 10, 1996 Chief Complaint:   Routine Prenatal Visit  History of Present Illness:   Susan Lara is a 24 y.o. G1P0 female at [redacted]w[redacted]d with an Estimated Date of Delivery: 07/23/20 being seen today for ongoing management of a low-risk pregnancy.  Depression screen [facility] 2/9 04/17/2020 01/10/2020 11/22/2019  Decreased Interest 0 0 0  Down, Depressed, Hopeless 0 0 0  PHQ - 2 Score 0 0 0  Altered sleeping 0 0 -  Tired, decreased energy 0 0 -  Change in appetite 0 0 -  Feeling bad or failure about yourself  0 0 -  Trouble concentrating 0 0 -  Moving slowly or fidgety/restless 0 0 -  Suicidal thoughts 0 0 -  PHQ-9 Score 0 0 -  Difficult doing work/chores Not difficult at all - -    Today she reports no complaints. Contractions: Not present. Vag. Bleeding: None.  Movement: Present. denies leaking of fluid. Review of Systems:   Pertinent items are noted in HPI Denies abnormal vaginal discharge w/ itching/odor/irritation, headaches, visual changes, shortness of breath, chest pain, abdominal pain, severe nausea/vomiting, or problems with urination or bowel movements unless otherwise stated above. Pertinent History Reviewed:  Reviewed past medical,surgical, social, obstetrical and family history.  Reviewed problem list, medications and allergies. Physical Assessment:   Vitals:   04/17/20 0917  BP: (!) 83/53  Pulse: 70  Weight: 115 lb (52.2 kg)  Body mass index is 20.37 kg/m.        Physical Examination:   General appearance: Well appearing, and in no distress  Mental status: Alert, oriented to person, place, and time  Skin: Warm & dry  Cardiovascular: Normal heart rate noted  Respiratory: Normal respiratory effort, no distress  Abdomen: Soft, gravid, nontender  Pelvic: Cervical exam deferred         Extremities: Edema: None  Fetal Status: Fetal Heart Rate (bpm): 143 Fundal Height: 24 cm  Movement: Present    Chaperone: n/a    No results found for this or any previous visit (from the past 24 hour(s)).  Assessment & Plan:  1) Low-risk pregnancy G1P0 at [redacted]w[redacted]d with an Estimated Date of Delivery: 07/23/20    Meds: No orders of the defined types were placed in this encounter.  Labs/procedures today: pn2, wants tdap at next in person visit  Plan:  Continue routine obstetrical care  Next visit: prefers online    Reviewed: Preterm labor symptoms and general obstetric precautions including but not limited to vaginal bleeding, contractions, leaking of fluid and fetal movement were reviewed in detail with the patient.  All questions were answered. Has home bp cuff.  Check bp weekly, let us know if >140/90.   Follow-up: Return in about 4 weeks (around 05/15/2020) for LROB, CNM, MyChart Video.  Orders Placed This Encounter  Procedures  . POC Urinalysis Dipstick OB   Cheral Marker CNM, [facility] 04/17/2020 9:37 AM

## 2020-04-17 NOTE — Patient Instructions (Signed)
Susan Lara, I greatly value your feedback.  If you receive a survey following your visit with us today, we appreciate you taking the time to fill it out.  Thanks, Kim Zuleica Seith, CNM, WHNP-BC   [facility] ([address] [place], [place] [address]) Entrance C, located off of [address] Free 24/7 valet parking  Go to [email or web address] to register for FREE online childbirth classes   Call the office ([phone]) or go to [facility] if:  You begin to have strong, frequent contractions  Your water breaks.  Sometimes it is a big gush of fluid, sometimes it is just a trickle that keeps getting your panties wet or running down your legs  You have vaginal bleeding.  It is normal to have a small amount of spotting if your cervix was checked.   You don't feel your baby moving like normal.  If you don't, get you something to eat and drink and lay down and focus on feeling your baby move.  You should feel at least 10 movements in 2 hours.  If you don't, you should call the office or go to [facility].    Tdap Vaccine  It is recommended that you get the Tdap vaccine during the third trimester of EACH pregnancy to help protect your baby from getting pertussis (whooping cough)  27-36 weeks is the BEST time to do this so that you can pass the protection on to your baby. During pregnancy is better than after pregnancy, but if you are unable to get it during pregnancy it will be offered at the hospital.   You can get this vaccine with us, at the health department, your family doctor, or some local pharmacies  Everyone who will be around your baby should also be up-to-date on their vaccines before the baby comes. Adults (who are not pregnant) only need 1 dose of Tdap during adulthood.   [place] Pediatricians/Family Doctors:  [facility] [phone]            [facility] [phone]                  [facility]  [phone] (usually not accepting new patients unless you have family there already, you are always welcome to call and ask)       [facility] [phone]       [place] Pediatricians/Family Doctors:   [facility]: [phone]  [facility]: [phone]  [facility] [place]: [phone]  [place] Family Doctors:   [facility]: [phone]   [facility]: [phone]  [place] Family Doctors:  [facility]: [phone]   Home Blood Pressure Monitoring for Patients   Your provider has recommended that you check your blood pressure (BP) at least once a week at home. If you do not have a blood pressure cuff at home, one will be provided for you. Contact your provider if you have not received your monitor within 1 week.   Helpful Tips for Accurate Home Blood Pressure Checks  . Don't smoke, exercise, or drink caffeine 30 minutes before checking your BP . Use the restroom before checking your BP (a full bladder can raise your pressure) . Relax in a comfortable upright chair . Feet on the ground . Left arm resting comfortably on a flat surface at the level of your heart . Legs uncrossed . Back supported . Sit quietly and don't talk . Place the cuff on your   bare arm . Adjust snuggly, so that only two fingertips can fit between your skin and the top of the cuff . Check 2 readings separated by at least one minute . Keep a log of your BP readings . For a visual, please reference this diagram: http://ccnc.care/bpdiagram  Provider Name: [facility]     Phone: [phone]  Zone 1: ALL CLEAR  Continue to monitor your symptoms:  . BP reading is less than 140 (top number) or less than 90 (bottom number)  . No right upper stomach pain . No headaches or seeing spots . No feeling nauseated or throwing up . No swelling in face and hands  Zone 2: CAUTION Call your  doctor's office for any of the following:  . BP reading is greater than 140 (top number) or greater than 90 (bottom number)  . Stomach pain under your ribs in the middle or right side . Headaches or seeing spots . Feeling nauseated or throwing up . Swelling in face and hands  Zone 3: EMERGENCY  Seek immediate medical care if you have any of the following:  . BP reading is greater than160 (top number) or greater than 110 (bottom number) . Severe headaches not improving with Tylenol . Serious difficulty catching your breath . Any worsening symptoms from Zone 2   Third Trimester of Pregnancy The third trimester is from week 29 through week 42, months 7 through 9. The third trimester is a time when the fetus is growing rapidly. At the end of the ninth month, the fetus is about 20 inches in length and weighs 6-10 pounds.  BODY CHANGES Your body goes through many changes during pregnancy. The changes vary from woman to woman.   Your weight will continue to increase. You can expect to gain 25-35 pounds (11-16 kg) by the end of the pregnancy.  You may begin to get stretch marks on your hips, abdomen, and breasts.  You may urinate more often because the fetus is moving lower into your pelvis and pressing on your bladder.  You may develop or continue to have heartburn as a result of your pregnancy.  You may develop constipation because certain hormones are causing the muscles that push waste through your intestines to slow down.  You may develop hemorrhoids or swollen, bulging veins (varicose veins).  You may have pelvic pain because of the weight gain and pregnancy hormones relaxing your joints between the bones in your pelvis. Backaches may result from overexertion of the muscles supporting your posture.  You may have changes in your hair. These can include thickening of your hair, rapid growth, and changes in texture. Some women also have hair loss during or after pregnancy, or hair that  feels dry or thin. Your hair will most likely return to normal after your baby is born.  Your breasts will continue to grow and be tender. A yellow discharge may leak from your breasts called colostrum.  Your belly button may stick out.  You may feel short of breath because of your expanding uterus.  You may notice the fetus "dropping," or moving lower in your abdomen.  You may have a bloody mucus discharge. This usually occurs a few days to a week before labor begins.  Your cervix becomes thin and soft (effaced) near your due date. WHAT TO EXPECT AT YOUR PRENATAL EXAMS  You will have prenatal exams every 2 weeks until week 36. Then, you will have weekly prenatal exams. During a routine prenatal visit:  You will be weighed to make sure you and the fetus are growing normally.  Your blood pressure is taken.  Your abdomen will be measured to track your baby's growth.  The fetal heartbeat will be listened to.  Any test results from the previous visit will be discussed.  You may have a cervical check near your due date to see if you have effaced. At around 36 weeks, your caregiver will check your cervix. At the same time, your caregiver will also perform a test on the secretions of the vaginal tissue. This test is to determine if a type of bacteria, Group B streptococcus, is present. Your caregiver will explain this further. Your caregiver may ask you:  What your birth plan is.  How you are feeling.  If you are feeling the baby move.  If you have had any abnormal symptoms, such as leaking fluid, bleeding, severe headaches, or abdominal cramping.  If you have any questions. Other tests or screenings that may be performed during your third trimester include:  Blood tests that check for low iron levels (anemia).  Fetal testing to check the health, activity level, and growth of the fetus. Testing is done if you have certain medical conditions or if there are problems during the  pregnancy. FALSE LABOR You may feel small, irregular contractions that eventually go away. These are called Braxton Hicks contractions, or false labor. Contractions may last for hours, days, or even weeks before true labor sets in. If contractions come at regular intervals, intensify, or become painful, it is best to be seen by your caregiver.  SIGNS OF LABOR   Menstrual-like cramps.  Contractions that are 5 minutes apart or less.  Contractions that start on the top of the uterus and spread down to the lower abdomen and back.  A sense of increased pelvic pressure or back pain.  A watery or bloody mucus discharge that comes from the vagina. If you have any of these signs before the 37th week of pregnancy, call your caregiver right away. You need to go to the hospital to get checked immediately. HOME CARE INSTRUCTIONS   Avoid all smoking, herbs, alcohol, and unprescribed drugs. These chemicals affect the formation and growth of the baby.  Follow your caregiver's instructions regarding medicine use. There are medicines that are either safe or unsafe to take during pregnancy.  Exercise only as directed by your caregiver. Experiencing uterine cramps is a good sign to stop exercising.  Continue to eat regular, healthy meals.  Wear a good support bra for breast tenderness.  Do not use hot tubs, steam rooms, or saunas.  Wear your seat belt at all times when driving.  Avoid raw meat, uncooked cheese, cat litter boxes, and soil used by cats. These carry germs that can cause birth defects in the baby.  Take your prenatal vitamins.  Try taking a stool softener (if your caregiver approves) if you develop constipation. Eat more high-fiber foods, such as fresh vegetables or fruit and whole grains. Drink plenty of fluids to keep your urine clear or pale yellow.  Take warm sitz baths to soothe any pain or discomfort caused by hemorrhoids. Use hemorrhoid cream if your caregiver approves.  If you  develop varicose veins, wear support hose. Elevate your feet for 15 minutes, 3-4 times a day. Limit salt in your diet.  Avoid heavy lifting, wear low heal shoes, and practice good posture.  Rest a lot with your legs elevated if you have leg cramps or low  back pain.  Visit your dentist if you have not gone during your pregnancy. Use a soft toothbrush to brush your teeth and be gentle when you floss.  A sexual relationship may be continued unless your caregiver directs you otherwise.  Do not travel far distances unless it is absolutely necessary and only with the approval of your caregiver.  Take prenatal classes to understand, practice, and ask questions about the labor and delivery.  Make a trial run to the hospital.  Pack your hospital bag.  Prepare the baby's nursery.  Continue to go to all your prenatal visits as directed by your caregiver. SEEK MEDICAL CARE IF:  You are unsure if you are in labor or if your water has broken.  You have dizziness.  You have mild pelvic cramps, pelvic pressure, or nagging pain in your abdominal area.  You have persistent nausea, vomiting, or diarrhea.  You have a bad smelling vaginal discharge.  You have pain with urination. SEEK IMMEDIATE MEDICAL CARE IF:   You have a fever.  You are leaking fluid from your vagina.  You have spotting or bleeding from your vagina.  You have severe abdominal cramping or pain.  You have rapid weight loss or gain.  You have shortness of breath with chest pain.  You notice sudden or extreme swelling of your face, hands, ankles, feet, or legs.  You have not felt your baby move in over an hour.  You have severe headaches that do not go away with medicine.  You have vision changes. Document Released: 11/17/2001 Document Revised: 11/28/2013 Document Reviewed: 01/24/2013 [facility] Patient Information 2015 [place], [place]. This information is not intended to replace advice given to you by your health  care provider. Make sure you discuss any questions you have with your health care provider.

## 2020-04-18 LAB — GLUCOSE TOLERANCE, 2 HOURS W/ 1HR
Glucose, 1 hour: 97 mg/dL (ref 65–179)
Glucose, 2 hour: 57 mg/dL — ABNORMAL LOW (ref 65–152)
Glucose, Fasting: 61 mg/dL — ABNORMAL LOW (ref 65–91)

## 2020-04-18 LAB — ANTIBODY SCREEN: Antibody Screen: NEGATIVE

## 2020-04-18 LAB — CBC
Hematocrit: 34.9 % (ref 34.0–46.6)
Hemoglobin: 11.8 g/dL (ref 11.1–15.9)
MCH: 29.4 pg (ref 26.6–33.0)
MCHC: 33.8 g/dL (ref 31.5–35.7)
MCV: 87 fL (ref 79–97)
Platelets: 263 10*3/uL (ref 150–450)
RBC: 4.01 x10E6/uL (ref 3.77–5.28)
RDW: 12.5 % (ref 11.7–15.4)
WBC: 10.5 10*3/uL (ref 3.4–10.8)

## 2020-04-18 LAB — RPR: RPR Ser Ql: NONREACTIVE

## 2020-04-18 LAB — HIV ANTIBODY (ROUTINE TESTING W REFLEX): HIV Screen 4th Generation wRfx: NONREACTIVE

## 2020-05-11 ENCOUNTER — Encounter (HOSPITAL_COMMUNITY): Payer: Self-pay | Admitting: Family Medicine

## 2020-05-11 ENCOUNTER — Other Ambulatory Visit: Payer: Self-pay

## 2020-05-11 ENCOUNTER — Inpatient Hospital Stay (HOSPITAL_COMMUNITY)
Admission: AD | Admit: 2020-05-11 | Discharge: 2020-05-12 | Disposition: A | Discharge status: Home / Self Care | Payer: Medicaid Other | Attending: Family Medicine | Admitting: Family Medicine

## 2020-05-11 DIAGNOSIS — R109 Unspecified abdominal pain: Secondary | ICD-10-CM

## 2020-05-11 DIAGNOSIS — O26833 Pregnancy related renal disease, third trimester: Secondary | ICD-10-CM | POA: Insufficient documentation

## 2020-05-11 DIAGNOSIS — Z3A29 29 weeks gestation of pregnancy: Secondary | ICD-10-CM

## 2020-05-11 DIAGNOSIS — N200 Calculus of kidney: Secondary | ICD-10-CM

## 2020-05-11 DIAGNOSIS — Z3689 Encounter for other specified antenatal screening: Secondary | ICD-10-CM | POA: Insufficient documentation

## 2020-05-11 NOTE — MAU Note (Signed)
abd cramping at 10:30 pm, mainly 2 big cramps, never had anything like that before.  On the way here, still having some pain.  None since arrival here. No bleeding. No leaking. Baby moving like normal today but none in the last 2 hours.

## 2020-05-12 ENCOUNTER — Inpatient Hospital Stay (HOSPITAL_COMMUNITY): Payer: Medicaid Other

## 2020-05-12 DIAGNOSIS — Z3689 Encounter for other specified antenatal screening: Secondary | ICD-10-CM | POA: Diagnosis not present

## 2020-05-12 DIAGNOSIS — N200 Calculus of kidney: Secondary | ICD-10-CM

## 2020-05-12 DIAGNOSIS — R109 Unspecified abdominal pain: Secondary | ICD-10-CM

## 2020-05-12 DIAGNOSIS — O26893 Other specified pregnancy related conditions, third trimester: Secondary | ICD-10-CM

## 2020-05-12 DIAGNOSIS — O26833 Pregnancy related renal disease, third trimester: Secondary | ICD-10-CM | POA: Diagnosis not present

## 2020-05-12 DIAGNOSIS — Z3A29 29 weeks gestation of pregnancy: Secondary | ICD-10-CM | POA: Diagnosis not present

## 2020-05-12 LAB — CBC WITH DIFFERENTIAL/PLATELET
Abs Immature Granulocytes: 0.37 10*3/uL — ABNORMAL HIGH (ref 0.00–0.07)
Basophils Absolute: 0.1 10*3/uL (ref 0.0–0.1)
Basophils Relative: 1 %
Eosinophils Absolute: 0.1 10*3/uL (ref 0.0–0.5)
Eosinophils Relative: 1 %
HCT: 31.6 % — ABNORMAL LOW (ref 36.0–46.0)
Hemoglobin: 10.5 g/dL — ABNORMAL LOW (ref 12.0–15.0)
Immature Granulocytes: 3 %
Lymphocytes Relative: 17 %
Lymphs Abs: 1.9 10*3/uL (ref 0.7–4.0)
MCH: 28.9 pg (ref 26.0–34.0)
MCHC: 33.2 g/dL (ref 30.0–36.0)
MCV: 87.1 fL (ref 80.0–100.0)
Monocytes Absolute: 1 10*3/uL (ref 0.1–1.0)
Monocytes Relative: 9 %
Neutro Abs: 8 10*3/uL — ABNORMAL HIGH (ref 1.7–7.7)
Neutrophils Relative %: 69 %
Platelets: 231 10*3/uL (ref 150–400)
RBC: 3.63 MIL/uL — ABNORMAL LOW (ref 3.87–5.11)
RDW: 12.9 % (ref 11.5–15.5)
WBC: 11.5 10*3/uL — ABNORMAL HIGH (ref 4.0–10.5)
nRBC: 0 % (ref 0.0–0.2)

## 2020-05-12 LAB — URINALYSIS, ROUTINE W REFLEX MICROSCOPIC
Bilirubin Urine: NEGATIVE
Glucose, UA: NEGATIVE mg/dL
Hgb urine dipstick: NEGATIVE
Ketones, ur: NEGATIVE mg/dL
Nitrite: NEGATIVE
Protein, ur: NEGATIVE mg/dL
Specific Gravity, Urine: 1.018 (ref 1.005–1.030)
pH: 6 (ref 5.0–8.0)

## 2020-05-12 LAB — COMPREHENSIVE METABOLIC PANEL
ALT: 13 U/L (ref 0–44)
AST: 20 U/L (ref 15–41)
Albumin: 2.7 g/dL — ABNORMAL LOW (ref 3.5–5.0)
Alkaline Phosphatase: 89 U/L (ref 38–126)
Anion gap: 7 (ref 5–15)
BUN: 8 mg/dL (ref 6–20)
CO2: 23 mmol/L (ref 22–32)
Calcium: 8.9 mg/dL (ref 8.9–10.3)
Chloride: 105 mmol/L (ref 98–111)
Creatinine, Ser: 0.57 mg/dL (ref 0.44–1.00)
GFR calc Af Amer: >60 mL/min (ref >60)
GFR calc non Af Amer: >60 mL/min (ref >60)
Glucose, Bld: 81 mg/dL (ref 70–99)
Potassium: 3.8 mmol/L (ref 3.5–5.1)
Sodium: 135 mmol/L (ref 135–145)
Total Bilirubin: 0.5 mg/dL (ref 0.3–1.2)
Total Protein: 5.9 g/dL — ABNORMAL LOW (ref 6.5–8.1)

## 2020-05-12 MED ORDER — TAMSULOSIN HCL 0.4 MG PO CAPS: 0.4000 mg | ORAL_CAPSULE | Freq: Every day | ORAL | 0 refills | Status: DC

## 2020-05-12 NOTE — MAU Provider Note (Signed)
History     CSN: [id number]  Arrival date and time: 05/11/20 2321   First Provider Initiated Contact with Patient 05/12/20 0015      Chief Complaint  Patient presents with  . Abdominal Cramping   Susan Lara is a 24 y.o. G1P0 at [redacted]w[redacted]d who presents to MAU with complaints of abdominal pain. Patient reports having abdominal pain start around 2200 when she was trying to get ready for bed. Describes abdominal pain as sharp cramping that was specific to right side. Rates pain 9/10- has not taken any medication for pain. Patient reports that she tried to lay down and abdominal pain continued, "has never felt anything like this". She denies having any lower abdominal cramping or pain. Denies vaginal bleeding, discharge or LOF. +FM. Patient reports since being in MAU pain has resolved on own but wanted to make sure everything was okay. She denies any urinary symptoms.    OB History    Gravida  1   Para      Term      Preterm      AB      Living        SAB      TAB      Ectopic      Multiple      Live Births              Past Medical History:  Diagnosis Date  . Medical history non-contributory     Past Surgical History:  Procedure Laterality Date  . ESOPHAGEAL MANOMETRY N/A 03/07/2018   Procedure: ESOPHAGEAL MANOMETRY (EM);  Surgeon: Jeani Hawking, MD;  Location: [facility];  Service: Endoscopy;  Laterality: N/A;  . SINUS EXPLORATION  2011    Family History  Problem Relation Age of Onset  . Cancer Paternal Grandmother        breast  . Cancer Maternal Grandfather        brain    Social History   Tobacco Use  . Smoking status: Never Smoker  . Smokeless tobacco: Never Used  Substance Use Topics  . Alcohol use: Not Currently  . Drug use: Never    Allergies: No Known Allergies  Medications Prior to Admission  Medication Sig Dispense Refill Last Dose  . Prenatal Vit-Iron Carbonyl-FA (PRENATAL PLUS IRON) 29-1 MG TABS Take 1 tablet by mouth daily.  30 tablet 12 05/11/2020 at Unknown time  . Blood Pressure Monitor MISC For regular home bp monitoring during pregnancy 1 each 0   . Doxylamine-Pyridoxine 10-10 MG TBEC Take 2 tablets by mouth at bedtime. Two tabs at bedtime, Increase by one tablet in the morning, and one at lunch, as needed. (Patient not taking: Reported on 03/20/2020) 60 tablet 3     Review of Systems  Constitutional: Negative.   Respiratory: Negative.   Cardiovascular: Negative.   Gastrointestinal: Positive for abdominal pain. Negative for constipation, diarrhea, nausea and vomiting.  Genitourinary: Negative.   Musculoskeletal: Negative.   Neurological: Negative.   Psychiatric/Behavioral: Negative.    Physical Exam   Blood pressure (!) 89/56, pulse 74, temperature 98.9 F (37.2 C), temperature source Oral, resp. rate 16, height 5\' 3"  (1.6 m), weight 53.5 kg, last menstrual period 10/17/2019, SpO2 100 %.  Physical Exam  Nursing note and vitals reviewed. Constitutional: She appears well-developed and well-nourished. No distress.  Cardiovascular: Normal rate and regular rhythm.  Respiratory: Effort normal and breath sounds normal. No respiratory distress. She has no wheezes.  GI: Soft. There is abdominal  tenderness. There is CVA tenderness. There is no rebound and no guarding.  Gravid appropriate for gestational age. Right flank and CVA tenderness.   Musculoskeletal:        General: No edema. Normal range of motion.  Neurological: She is alert.  Skin: Skin is warm and dry.  Psychiatric: She has a normal mood and affect. Her behavior is normal. Thought content normal.   FHR: 130/moderate/+accels/ no decelerations  Toco: UI   MAU Course  Procedures  MDM Orders Placed This Encounter  Procedures  . US RENAL  . Urinalysis, Routine w reflex microscopic  . CBC with Differential/Platelet  . Comprehensive metabolic panel   Labs and [place] report reviewed:  Results for orders placed or performed during the hospital  encounter of 05/11/20 (from the past 24 hour(s))  Urinalysis, Routine w reflex microscopic     Status: Abnormal   Collection Time: 05/12/20 12:08 AM  Result Value Ref Range   Color, Urine YELLOW YELLOW   APPearance HAZY (A) CLEAR   Specific Gravity, Urine 1.018 1.005 - 1.030   pH 6.0 5.0 - 8.0   Glucose, UA NEGATIVE NEGATIVE mg/dL   Hgb urine dipstick NEGATIVE NEGATIVE   Bilirubin Urine NEGATIVE NEGATIVE   Ketones, ur NEGATIVE NEGATIVE mg/dL   Protein, ur NEGATIVE NEGATIVE mg/dL   Nitrite NEGATIVE NEGATIVE   Leukocytes,Ua LARGE (A) NEGATIVE   RBC / HPF 0-5 0 - 5 RBC/hpf   WBC, UA 6-10 0 - 5 WBC/hpf   Bacteria, UA RARE (A) NONE SEEN   Squamous Epithelial / LPF 0-5 0 - 5   Mucus PRESENT   CBC with Differential/Platelet     Status: Abnormal   Collection Time: 05/12/20 12:30 AM  Result Value Ref Range   WBC 11.5 (H) 4.0 - 10.5 K/uL   RBC 3.63 (L) 3.87 - 5.11 MIL/uL   Hemoglobin 10.5 (L) 12.0 - 15.0 g/dL   HCT 04.5 (L) 40.9 - 81.1 %   MCV 87.1 80.0 - 100.0 fL   MCH 28.9 26.0 - 34.0 pg   MCHC 33.2 30.0 - 36.0 g/dL   RDW 91.4 78.2 - 95.6 %   Platelets 231 150 - 400 K/uL   nRBC 0.0 0.0 - 0.2 %   Neutrophils Relative % 69 %   Neutro Abs 8.0 (H) 1.7 - 7.7 K/uL   Lymphocytes Relative 17 %   Lymphs Abs 1.9 0.7 - 4.0 K/uL   Monocytes Relative 9 %   Monocytes Absolute 1.0 0.1 - 1.0 K/uL   Eosinophils Relative 1 %   Eosinophils Absolute 0.1 0.0 - 0.5 K/uL   Basophils Relative 1 %   Basophils Absolute 0.1 0.0 - 0.1 K/uL   Immature Granulocytes 3 %   Abs Immature Granulocytes 0.37 (H) 0.00 - 0.07 K/uL  Comprehensive metabolic panel     Status: Abnormal   Collection Time: 05/12/20 12:30 AM  Result Value Ref Range   Sodium 135 135 - 145 mmol/L   Potassium 3.8 3.5 - 5.1 mmol/L   Chloride 105 98 - 111 mmol/L   CO2 23 22 - 32 mmol/L   Glucose, Bld 81 70 - 99 mg/dL   BUN 8 6 - 20 mg/dL   Creatinine, Ser 2.13 0.44 - 1.00 mg/dL   Calcium 8.9 8.9 - 08.6 mg/dL   Total Protein 5.9 (L) 6.5  - 8.1 g/dL   Albumin 2.7 (L) 3.5 - 5.0 g/dL   AST 20 15 - 41 U/L   ALT 13 0 - 44  U/L   Alkaline Phosphatase 89 38 - 126 U/L   Total Bilirubin 0.5 0.3 - 1.2 mg/dL   GFR calc non Af Amer >60 >60 mL/min   GFR calc Af Amer >60 >60 mL/min   Anion gap 7 5 - 15   US RENAL  Result Date: 05/12/2020 CLINICAL DATA:  Initial evaluation for acute right flank pain. EXAM: RENAL / URINARY TRACT ULTRASOUND COMPLETE COMPARISON:  None. FINDINGS: Right Kidney: Renal measurements: 9.3 x 5.7 x 5.0 cm = volume: 138 mL. Renal echogenicity within normal limits. 3 mm nonobstructive calculus present at the upper pole. No hydronephrosis. No focal renal mass. Left Kidney: Renal measurements: 8.6 x 5.1 x 4.0 cm = volume: 91.5 mL. Renal echogenicity within normal limits. No nephrolithiasis or hydronephrosis. No focal renal mass. Bladder: Appears normal for degree of bladder distention. A right ureteral jet is visualized. Other: None. IMPRESSION: 1. 3 mm nonobstructive stone at the upper pole the right kidney. No sonographic evidence for obstructive uropathy. 2. Otherwise normal renal ultrasound. Electronically Signed   By: Jeannine Boga M.D.   On: 05/12/2020 01:19   Urine culture ordered based on findings of nephrolithiasis in right kidney plus increased leukocytes on urinalysis. Urine culture pending and will call patient with results, manage accordingly.   Discussed results of [place] and labs with patient. Discussed with patient finding of nephrolithiasis and need to increase amount of water she consumes in order to help pass stone in addition to Flomax. Patient reports that she drinks around 2-3 bottles of water per day, encouraged patient to increase to at least 7 per day. Discussed safe medications during pregnancy and the use of tylenol for pain. If pain is not relieved with tylenol then to call office for additional medication, patient verbalizes understanding.  NST reactive and reassuring.   Discussed reasons to  return to MAU. Follow up as scheduled in the office. Return to MAU as needed. Pt stable at time of discharge.    Assessment and Plan   1. Right nephrolithiasis   2. Acute right flank pain   3. [redacted] weeks gestation of pregnancy   4. NST (non-stress test) reactive    Discharge home Follow up as scheduled in the office for prenatal care Return to MAU as needed for reasons discussed and/or emergencies  Rx for Flomax sent to pharmacy of choice  Hydration and increasing water consumption to 7 bottles per day  Strainer given to patient prior to discharge from MAU   Follow-up Information    [facility]. Go on 05/15/2020.   Specialty: Obstetrics and Gynecology Contact information: [place] [phone]         Allergies as of 05/12/2020   No Known Allergies     Medication List    TAKE these medications   Blood Pressure Monitor Misc For regular home bp monitoring during pregnancy   Doxylamine-Pyridoxine 10-10 MG Tbec Take 2 tablets by mouth at bedtime. Two tabs at bedtime, Increase by one tablet in the morning, and one at lunch, as needed.   Prenatal Plus Iron 29-1 MG Tabs Take 1 tablet by mouth daily.   tamsulosin 0.4 MG Caps capsule Commonly known as: FLOMAX Take 1 capsule (0.4 mg total) by mouth daily after breakfast.       Lajean Manes CNM 05/12/2020, 1:47 AM

## 2020-05-13 LAB — CULTURE, OB URINE

## 2020-05-15 ENCOUNTER — Encounter: Payer: Self-pay | Admitting: *Deleted

## 2020-05-15 ENCOUNTER — Encounter: Payer: Self-pay | Admitting: Women's Health

## 2020-05-15 ENCOUNTER — Other Ambulatory Visit: Payer: Self-pay | Admitting: *Deleted

## 2020-05-15 ENCOUNTER — Ambulatory Visit (INDEPENDENT_AMBULATORY_CARE_PROVIDER_SITE_OTHER): Payer: Medicaid Other | Admitting: Women's Health

## 2020-05-15 VITALS — BP 90/55 | HR 88 | Wt 116.0 lb

## 2020-05-15 DIAGNOSIS — O26843 Uterine size-date discrepancy, third trimester: Secondary | ICD-10-CM

## 2020-05-15 DIAGNOSIS — Z3A30 30 weeks gestation of pregnancy: Secondary | ICD-10-CM

## 2020-05-15 DIAGNOSIS — Z3403 Encounter for supervision of normal first pregnancy, third trimester: Secondary | ICD-10-CM

## 2020-05-15 DIAGNOSIS — R82998 Other abnormal findings in urine: Secondary | ICD-10-CM

## 2020-05-15 DIAGNOSIS — Z1389 Encounter for screening for other disorder: Secondary | ICD-10-CM

## 2020-05-15 DIAGNOSIS — Z331 Pregnant state, incidental: Secondary | ICD-10-CM

## 2020-05-15 DIAGNOSIS — O99891 Other specified diseases and conditions complicating pregnancy: Secondary | ICD-10-CM

## 2020-05-15 LAB — POCT URINALYSIS DIPSTICK OB
Blood, UA: NEGATIVE
Glucose, UA: NEGATIVE
Ketones, UA: NEGATIVE
Nitrite, UA: NEGATIVE

## 2020-05-15 NOTE — Progress Notes (Signed)
LOW-RISK PREGNANCY VISIT Patient name: Susan Lara MRN [id number]  Date of birth: 10-16-1996 Chief Complaint:   Routine Prenatal Visit (tdap not today)  History of Present Illness:   Susan Lara is a 24 y.o. G1P0 female at [redacted]w[redacted]d with an Estimated Date of Delivery: 07/23/20 being seen today for ongoing management of a low-risk pregnancy.  Depression screen [facility] 2/9 04/17/2020 01/10/2020 11/22/2019  Decreased Interest 0 0 0  Down, Depressed, Hopeless 0 0 0  PHQ - 2 Score 0 0 0  Altered sleeping 0 0 -  Tired, decreased energy 0 0 -  Change in appetite 0 0 -  Feeling bad or failure about yourself  0 0 -  Trouble concentrating 0 0 -  Moving slowly or fidgety/restless 0 0 -  Suicidal thoughts 0 0 -  PHQ-9 Score 0 0 -  Difficult doing work/chores Not difficult at all - -    Today she reports went to [facility] few days ago, dx w/ Rt kidney stone, given flomax, pain has completely resolved, not sure if she passed stone or not. Contractions: Not present.  .  Movement: Present. denies leaking of fluid. Review of Systems:   Pertinent items are noted in HPI Denies abnormal vaginal discharge w/ itching/odor/irritation, headaches, visual changes, shortness of breath, chest pain, abdominal pain, severe nausea/vomiting, or problems with urination or bowel movements unless otherwise stated above. Pertinent History Reviewed:  Reviewed past medical,surgical, social, obstetrical and family history.  Reviewed problem list, medications and allergies. Physical Assessment:   Vitals:   05/15/20 0913  BP: (!) 90/55  Pulse: 88  Weight: 116 lb (52.6 kg)  Body mass index is 20.55 kg/m.        Physical Examination:   General appearance: Well appearing, and in no distress  Mental status: Alert, oriented to person, place, and time  Skin: Warm & dry  Cardiovascular: Normal heart rate noted  Respiratory: Normal respiratory effort, no distress  Abdomen: Soft, gravid, nontender  Pelvic: Cervical exam  deferred         Extremities: Edema: None  Fetal Status: Fetal Heart Rate (bpm): 140 Fundal Height: 26 cm Movement: Present    Chaperone: n/a    Results for orders placed or performed in visit on 05/15/20 (from the past 24 hour(s))  POC Urinalysis Dipstick OB   Collection Time: 05/15/20  9:21 AM  Result Value Ref Range   Color, UA     Clarity, UA     Glucose, UA Negative Negative   Bilirubin, UA     Ketones, UA neg    Spec Grav, UA     Blood, UA neg    pH, UA     POC,PROTEIN,UA Small (1+) Negative, Trace, Small (1+), Moderate (2+), Large (3+), 4+   Urobilinogen, UA     Nitrite, UA neg    Leukocytes, UA Moderate (2+) (A) Negative   Appearance     Odor      Assessment & Plan:  1) Low-risk pregnancy G1P0 at [redacted]w[redacted]d with an Estimated Date of Delivery: 07/23/20   2) Rt nephrolithiasis, dx few days ago in [facility], pain has resolved  3) Uterine size <dates- will get efw/afi u/s  4) Leuks/protein in urine>send urine cx   Meds: No orders of the defined types were placed in this encounter.  Labs/procedures today: declines tdap today, maybe next visit  Plan:  Continue routine obstetrical care  Next visit: prefers in person    Reviewed: Preterm labor symptoms and general  obstetric precautions including but not limited to vaginal bleeding, contractions, leaking of fluid and fetal movement were reviewed in detail with the patient.  All questions were answered.   Follow-up: Return in about 2 weeks (around 05/29/2020) for [place], [place]; ASAP EFW u/s w/ MFM.  Orders Placed This Encounter  Procedures  . Urine Culture  . POC Urinalysis Dipstick OB   Roma Schanz CNM, [facility] 05/15/2020 10:07 AM

## 2020-05-15 NOTE — Patient Instructions (Signed)
Joline Maxcy, I greatly value your feedback.  If you receive a survey following your visit with [place] today, we appreciate you taking the time to fill it out.  Thanks, Joellyn Haff, CNM, WHNP-BC   [facility] ([address] [place], [place] [address]) Entrance C, located off of [address] valet parking  Go to [email or web address] to register for FREE online childbirth classes   Call the office [phone]) or go to [facility] if:  You begin to have strong, frequent contractions  Your water breaks.  Sometimes it is a big gush of fluid, sometimes it is just a trickle that keeps getting your panties wet or running down your legs  You have vaginal bleeding.  It is normal to have a small amount of spotting if your cervix was checked.   You don't feel your baby moving like normal.  If you don't, get you something to eat and drink and lay down and focus on feeling your baby move.  You should feel at least 10 movements in 2 hours.  If you don't, you should call the office or go to [facility].    Tdap Vaccine  It is recommended that you get the Tdap vaccine during the third trimester of EACH pregnancy to help protect your baby from getting pertussis (whooping cough)  27-36 weeks is the BEST time to do this so that you can pass the protection on to your baby. During pregnancy is better than after pregnancy, but if you are unable to get it during pregnancy it will be offered at the hospital.   You can get this vaccine with [place], at the health department, your family doctor, or some local pharmacies  Everyone who will be around your baby should also be up-to-date on their vaccines before the baby comes. Adults (who are not pregnant) only need 1 dose of Tdap during adulthood.   [place] Pediatricians/Family Doctors:  [facility] [phone]            [facility] [phone]                 [facility]  [phone] (usually not accepting new patients unless you have family there already, you are always welcome to call and ask)       [facility] Department [phone]       [facility] Pediatricians/Family Doctors:   [facility]: [phone]  [facility]: [phone]  [facility]: [phone]  [facility] Doctors:   [facility]: [phone]   [facility]: [phone]  [facility] Doctors:  [facility]: [phone]   Home Blood Pressure Monitoring for Patients   Your provider has recommended that you check your blood pressure (BP) at least once a week at home. If you do not have a blood pressure cuff at home, one will be provided for you. Contact your provider if you have not received your monitor within 1 week.   Helpful Tips for Accurate Home Blood Pressure Checks  . Don't smoke, exercise, or drink caffeine 30 minutes before checking your BP . Use the restroom before checking your BP (a full bladder can raise your pressure) . Relax in a comfortable upright chair . Feet on the ground . Left arm resting comfortably on a flat surface at the level of your heart . Legs uncrossed . Back supported . Sit quietly and don't talk . Place the cuff on your  bare arm . Adjust snuggly, so that only two fingertips can fit between your skin and the top of the cuff . Check 2 readings separated by at least one minute . Keep a log of your BP readings . For a visual, please reference this diagram: http://ccnc.care/bpdiagram  Provider Name: [facility]     Phone: [phone]  Zone 1: ALL CLEAR  Continue to monitor your symptoms:  . BP reading is less than 140 (top number) or less than 90 (bottom number)  . No right upper stomach pain . No headaches or seeing spots . No feeling nauseated or throwing up . No swelling in face and hands  Zone 2: CAUTION Call your  doctor's office for any of the following:  . BP reading is greater than 140 (top number) or greater than 90 (bottom number)  . Stomach pain under your ribs in the middle or right side . Headaches or seeing spots . Feeling nauseated or throwing up . Swelling in face and hands  Zone 3: EMERGENCY  Seek immediate medical care if you have any of the following:  . BP reading is greater than160 (top number) or greater than 110 (bottom number) . Severe headaches not improving with Tylenol . Serious difficulty catching your breath . Any worsening symptoms from Zone 2   Third Trimester of Pregnancy The third trimester is from week 29 through week 42, months 7 through 9. The third trimester is a time when the fetus is growing rapidly. At the end of the ninth month, the fetus is about 20 inches in length and weighs 6-10 pounds.  BODY CHANGES Your body goes through many changes during pregnancy. The changes vary from woman to woman.   Your weight will continue to increase. You can expect to gain 25-35 pounds (11-16 kg) by the end of the pregnancy.  You may begin to get stretch marks on your hips, abdomen, and breasts.  You may urinate more often because the fetus is moving lower into your pelvis and pressing on your bladder.  You may develop or continue to have heartburn as a result of your pregnancy.  You may develop constipation because certain hormones are causing the muscles that push waste through your intestines to slow down.  You may develop hemorrhoids or swollen, bulging veins (varicose veins).  You may have pelvic pain because of the weight gain and pregnancy hormones relaxing your joints between the bones in your pelvis. Backaches may result from overexertion of the muscles supporting your posture.  You may have changes in your hair. These can include thickening of your hair, rapid growth, and changes in texture. Some women also have hair loss during or after pregnancy, or hair that  feels dry or thin. Your hair will most likely return to normal after your baby is born.  Your breasts will continue to grow and be tender. A yellow discharge may leak from your breasts called colostrum.  Your belly button may stick out.  You may feel short of breath because of your expanding uterus.  You may notice the fetus "dropping," or moving lower in your abdomen.  You may have a bloody mucus discharge. This usually occurs a few days to a week before labor begins.  Your cervix becomes thin and soft (effaced) near your due date. WHAT TO EXPECT AT YOUR PRENATAL EXAMS  You will have prenatal exams every 2 weeks until week 36. Then, you will have weekly prenatal exams. During a routine prenatal visit:  You will be weighed to make sure you and the fetus are growing normally.  Your blood pressure is taken.  Your abdomen will be measured to track your baby's growth.  The fetal heartbeat will be listened to.  Any test results from the previous visit will be discussed.  You may have a cervical check near your due date to see if you have effaced. At around 36 weeks, your caregiver will check your cervix. At the same time, your caregiver will also perform a test on the secretions of the vaginal tissue. This test is to determine if a type of bacteria, Group B streptococcus, is present. Your caregiver will explain this further. Your caregiver may ask you:  What your birth plan is.  How you are feeling.  If you are feeling the baby move.  If you have had any abnormal symptoms, such as leaking fluid, bleeding, severe headaches, or abdominal cramping.  If you have any questions. Other tests or screenings that may be performed during your third trimester include:  Blood tests that check for low iron levels (anemia).  Fetal testing to check the health, activity level, and growth of the fetus. Testing is done if you have certain medical conditions or if there are problems during the  pregnancy. FALSE LABOR You may feel small, irregular contractions that eventually go away. These are called Braxton Hicks contractions, or false labor. Contractions may last for hours, days, or even weeks before true labor sets in. If contractions come at regular intervals, intensify, or become painful, it is best to be seen by your caregiver.  SIGNS OF LABOR   Menstrual-like cramps.  Contractions that are 5 minutes apart or less.  Contractions that start on the top of the uterus and spread down to the lower abdomen and back.  A sense of increased pelvic pressure or back pain.  A watery or bloody mucus discharge that comes from the vagina. If you have any of these signs before the 37th week of pregnancy, call your caregiver right away. You need to go to the hospital to get checked immediately. HOME CARE INSTRUCTIONS   Avoid all smoking, herbs, alcohol, and unprescribed drugs. These chemicals affect the formation and growth of the baby.  Follow your caregiver's instructions regarding medicine use. There are medicines that are either safe or unsafe to take during pregnancy.  Exercise only as directed by your caregiver. Experiencing uterine cramps is a good sign to stop exercising.  Continue to eat regular, healthy meals.  Wear a good support bra for breast tenderness.  Do not use hot tubs, steam rooms, or saunas.  Wear your seat belt at all times when driving.  Avoid raw meat, uncooked cheese, cat litter boxes, and soil used by cats. These carry germs that can cause birth defects in the baby.  Take your prenatal vitamins.  Try taking a stool softener (if your caregiver approves) if you develop constipation. Eat more high-fiber foods, such as fresh vegetables or fruit and whole grains. Drink plenty of fluids to keep your urine clear or pale yellow.  Take warm sitz baths to soothe any pain or discomfort caused by hemorrhoids. Use hemorrhoid cream if your caregiver approves.  If you  develop varicose veins, wear support hose. Elevate your feet for 15 minutes, 3-4 times a day. Limit salt in your diet.  Avoid heavy lifting, wear low heal shoes, and practice good posture.  Rest a lot with your legs elevated if you have leg cramps or low  back pain.  Visit your dentist if you have not gone during your pregnancy. Use a soft toothbrush to brush your teeth and be gentle when you floss.  A sexual relationship may be continued unless your caregiver directs you otherwise.  Do not travel far distances unless it is absolutely necessary and only with the approval of your caregiver.  Take prenatal classes to understand, practice, and ask questions about the labor and delivery.  Make a trial run to the hospital.  Pack your hospital bag.  Prepare the baby's nursery.  Continue to go to all your prenatal visits as directed by your caregiver. SEEK MEDICAL CARE IF:  You are unsure if you are in labor or if your water has broken.  You have dizziness.  You have mild pelvic cramps, pelvic pressure, or nagging pain in your abdominal area.  You have persistent nausea, vomiting, or diarrhea.  You have a bad smelling vaginal discharge.  You have pain with urination. SEEK IMMEDIATE MEDICAL CARE IF:   You have a fever.  You are leaking fluid from your vagina.  You have spotting or bleeding from your vagina.  You have severe abdominal cramping or pain.  You have rapid weight loss or gain.  You have shortness of breath with chest pain.  You notice sudden or extreme swelling of your face, hands, ankles, feet, or legs.  You have not felt your baby move in over an hour.  You have severe headaches that do not go away with medicine.  You have vision changes. Document Released: 11/17/2001 Document Revised: 11/28/2013 Document Reviewed: 01/24/2013 [facility] Patient Information 2015 [place], [place]. This information is not intended to replace advice given to you by your health  care provider. Make sure you discuss any questions you have with your health care provider.

## 2020-05-16 ENCOUNTER — Ambulatory Visit: Payer: Medicaid Other | Attending: Obstetrics and Gynecology

## 2020-05-16 ENCOUNTER — Other Ambulatory Visit: Payer: Self-pay | Admitting: Women's Health

## 2020-05-16 ENCOUNTER — Other Ambulatory Visit: Payer: Self-pay

## 2020-05-16 DIAGNOSIS — O26843 Uterine size-date discrepancy, third trimester: Secondary | ICD-10-CM

## 2020-05-16 DIAGNOSIS — Z3403 Encounter for supervision of normal first pregnancy, third trimester: Secondary | ICD-10-CM

## 2020-05-16 DIAGNOSIS — Z3A30 30 weeks gestation of pregnancy: Secondary | ICD-10-CM

## 2020-05-16 DIAGNOSIS — Z363 Encounter for antenatal screening for malformations: Secondary | ICD-10-CM

## 2020-05-17 ENCOUNTER — Other Ambulatory Visit: Payer: Self-pay | Admitting: *Deleted

## 2020-05-17 ENCOUNTER — Encounter: Payer: Self-pay | Admitting: Women's Health

## 2020-05-17 DIAGNOSIS — O0990 Supervision of high risk pregnancy, unspecified, unspecified trimester: Secondary | ICD-10-CM

## 2020-05-17 DIAGNOSIS — O365930 Maternal care for other known or suspected poor fetal growth, third trimester, not applicable or unspecified: Secondary | ICD-10-CM

## 2020-05-17 LAB — URINE CULTURE

## 2020-05-23 ENCOUNTER — Ambulatory Visit: Payer: Medicaid Other | Admitting: *Deleted

## 2020-05-23 ENCOUNTER — Other Ambulatory Visit: Payer: Self-pay

## 2020-05-23 ENCOUNTER — Ambulatory Visit: Payer: Medicaid Other | Attending: Women's Health

## 2020-05-23 VITALS — BP 92/49 | HR 83

## 2020-05-23 DIAGNOSIS — O0990 Supervision of high risk pregnancy, unspecified, unspecified trimester: Secondary | ICD-10-CM | POA: Insufficient documentation

## 2020-05-23 DIAGNOSIS — Z3A31 31 weeks gestation of pregnancy: Secondary | ICD-10-CM | POA: Diagnosis not present

## 2020-05-23 DIAGNOSIS — O365930 Maternal care for other known or suspected poor fetal growth, third trimester, not applicable or unspecified: Secondary | ICD-10-CM | POA: Insufficient documentation

## 2020-05-28 ENCOUNTER — Ambulatory Visit (INDEPENDENT_AMBULATORY_CARE_PROVIDER_SITE_OTHER): Payer: Medicaid Other | Admitting: *Deleted

## 2020-05-28 ENCOUNTER — Telehealth: Payer: Self-pay | Admitting: Obstetrics & Gynecology

## 2020-05-28 ENCOUNTER — Other Ambulatory Visit: Payer: Medicaid Other

## 2020-05-28 ENCOUNTER — Other Ambulatory Visit: Payer: Self-pay

## 2020-05-28 VITALS — BP 97/62 | HR 76 | Wt 117.0 lb

## 2020-05-28 DIAGNOSIS — Z3A32 32 weeks gestation of pregnancy: Secondary | ICD-10-CM

## 2020-05-28 DIAGNOSIS — Z331 Pregnant state, incidental: Secondary | ICD-10-CM

## 2020-05-28 DIAGNOSIS — O365930 Maternal care for other known or suspected poor fetal growth, third trimester, not applicable or unspecified: Secondary | ICD-10-CM | POA: Diagnosis not present

## 2020-05-28 DIAGNOSIS — O0990 Supervision of high risk pregnancy, unspecified, unspecified trimester: Secondary | ICD-10-CM

## 2020-05-28 DIAGNOSIS — Z1389 Encounter for screening for other disorder: Secondary | ICD-10-CM

## 2020-05-28 NOTE — Telephone Encounter (Signed)
[facility] for patients appointment scheduled on 05/30/20

## 2020-05-28 NOTE — Progress Notes (Signed)
   NURSE VISIT- NST  SUBJECTIVE:  Susan Lara is a 24 y.o. G1P0 female at [redacted]w[redacted]d, here for a NST for pregnancy complicated by FGR.  She reports active fetal movement, contractions: none, vaginal bleeding: none, membranes: intact.   OBJECTIVE:  BP 97/62   Pulse 76   Wt 117 lb (53.1 kg)   LMP 10/17/2019 (Exact Date)   BMI 20.73 kg/m   Appears well, no apparent distress  No results found for this or any previous visit (from the past 24 hour(s)).  NST: FHR baseline 135 bpm, Variability: moderate, Accelerations:present, Decelerations:  Absent= Cat 1/Reactive Toco: none   ASSESSMENT: G1P0 at [redacted]w[redacted]d with FGR NST reactive  PLAN: EFM strip reviewed by Joellyn Haff, CNM, [facility]   Recommendations: keep next appointment as scheduled    Jobe Marker  05/28/2020 2:35 PM

## 2020-05-29 ENCOUNTER — Other Ambulatory Visit: Payer: Self-pay | Admitting: Women's Health

## 2020-05-29 ENCOUNTER — Encounter: Payer: Medicaid Other | Admitting: Advanced Practice Midwife

## 2020-05-29 DIAGNOSIS — IMO0002 Reserved for concepts with insufficient information to code with codable children: Secondary | ICD-10-CM

## 2020-05-30 ENCOUNTER — Ambulatory Visit: Payer: Medicaid Other

## 2020-05-31 ENCOUNTER — Ambulatory Visit (INDEPENDENT_AMBULATORY_CARE_PROVIDER_SITE_OTHER): Payer: Medicaid Other | Admitting: Obstetrics & Gynecology

## 2020-05-31 ENCOUNTER — Other Ambulatory Visit: Payer: Self-pay

## 2020-05-31 ENCOUNTER — Encounter: Payer: Self-pay | Admitting: Obstetrics & Gynecology

## 2020-05-31 ENCOUNTER — Ambulatory Visit (INDEPENDENT_AMBULATORY_CARE_PROVIDER_SITE_OTHER): Payer: Medicaid Other

## 2020-05-31 VITALS — BP 87/54 | HR 79 | Wt 118.0 lb

## 2020-05-31 DIAGNOSIS — Z3A32 32 weeks gestation of pregnancy: Secondary | ICD-10-CM

## 2020-05-31 DIAGNOSIS — O365930 Maternal care for other known or suspected poor fetal growth, third trimester, not applicable or unspecified: Secondary | ICD-10-CM

## 2020-05-31 DIAGNOSIS — IMO0002 Reserved for concepts with insufficient information to code with codable children: Secondary | ICD-10-CM

## 2020-05-31 DIAGNOSIS — Z1389 Encounter for screening for other disorder: Secondary | ICD-10-CM

## 2020-05-31 DIAGNOSIS — Z362 Encounter for other antenatal screening follow-up: Secondary | ICD-10-CM | POA: Diagnosis not present

## 2020-05-31 DIAGNOSIS — Z331 Pregnant state, incidental: Secondary | ICD-10-CM

## 2020-05-31 DIAGNOSIS — O0990 Supervision of high risk pregnancy, unspecified, unspecified trimester: Secondary | ICD-10-CM

## 2020-05-31 LAB — POCT URINALYSIS DIPSTICK OB
Blood, UA: NEGATIVE
Glucose, UA: NEGATIVE
Ketones, UA: NEGATIVE
Nitrite, UA: NEGATIVE

## 2020-05-31 NOTE — Progress Notes (Signed)
[place] 32+3 wks,cephalic,BPP 8/8,FHR 138 bpm,posterior placenta gr 3,afi 10.2 cm,RI .69,.73,.68=86%

## 2020-05-31 NOTE — Progress Notes (Signed)
HIGH-RISK PREGNANCY VISIT Patient name: Susan Lara MRN [id number]  Date of birth: 12-09-1995 Chief Complaint:   High Risk Gestation ([place] today)  History of Present Illness:   Susan Lara is a 24 y.o. G1P0 female at [redacted]w[redacted]d with an Estimated Date of Delivery: 07/23/20 being seen today for ongoing management of a high-risk pregnancy complicated by FGR, 5%.  Today she reports no complaints.  Depression screen [name] 2/9 04/17/2020 01/10/2020 11/22/2019  Decreased Interest 0 0 0  Down, Depressed, Hopeless 0 0 0  PHQ - 2 Score 0 0 0  Altered sleeping 0 0 -  Tired, decreased energy 0 0 -  Change in appetite 0 0 -  Feeling bad or failure about yourself  0 0 -  Trouble concentrating 0 0 -  Moving slowly or fidgety/restless 0 0 -  Suicidal thoughts 0 0 -  PHQ-9 Score 0 0 -  Difficult doing work/chores Not difficult at all - -    Contractions: Not present. Vag. Bleeding: None.  Movement: Present. denies leaking of fluid.  Review of Systems:   Pertinent items are noted in HPI Denies abnormal vaginal discharge w/ itching/odor/irritation, headaches, visual changes, shortness of breath, chest pain, abdominal pain, severe nausea/vomiting, or problems with urination or bowel movements unless otherwise stated above. Pertinent History Reviewed:  Reviewed past medical,surgical, social, obstetrical and family history.  Reviewed problem list, medications and allergies. Physical Assessment:   Vitals:   05/31/20 0915  BP: (!) 87/54  Pulse: 79  Weight: 118 lb (53.5 kg)  Body mass index is 20.9 kg/m.           Physical Examination:   General appearance: alert, well appearing, and in no distress  Mental status: alert, oriented to person, place, and time  Skin: warm & dry   Extremities: Edema: None    Cardiovascular: normal heart rate noted  Respiratory: normal respiratory effort, no distress  Abdomen: gravid, soft, non-tender  Pelvic: Cervical exam performed         Fetal Status:      Movement: Present    Fetal Surveillance Testing today: BPP 8/8 normal Doppler ratios   Chaperone: n/a    Results for orders placed or performed in visit on 05/31/20 (from the past 24 hour(s))  POC Urinalysis Dipstick OB   Collection Time: 05/31/20  9:12 AM  Result Value Ref Range   Color, UA     Clarity, UA     Glucose, UA Negative Negative   Bilirubin, UA     Ketones, UA neg    Spec Grav, UA     Blood, UA neg    pH, UA     POC,PROTEIN,UA Trace Negative, Trace, Small (1+), Moderate (2+), Large (3+), 4+   Urobilinogen, UA     Nitrite, UA neg    Leukocytes, UA Moderate (2+) (A) Negative   Appearance     Odor      Assessment & Plan:  1) High-risk pregnancy G1P0 at [redacted]w[redacted]d with an Estimated Date of Delivery: 07/23/20   2) FGR, stable, 5% @ 30 weeks, normal surveillance   Meds: No orders of the defined types were placed in this encounter.   Labs/procedures today: BPP 8/8 with normal Doppler flow 86% consistent EDF  Treatment Plan:  Twice weekly surveillance w/ delivery timing based on clinical course, 37-39 weeks  Reviewed: Preterm labor symptoms and general obstetric precautions including but not limited to vaginal bleeding, contractions, leaking of fluid and fetal movement were reviewed in  detail with the patient.  All questions were answered. Has home bp cuff. Rx faxed to . Check bp weekly, let us know if >140/90.   Follow-up: Return for keep scheduled.  Orders Placed This Encounter  Procedures  . POC Urinalysis Dipstick OB   Lazaro Arms  05/31/2020 9:47 AM

## 2020-06-03 ENCOUNTER — Other Ambulatory Visit: Payer: Medicaid Other

## 2020-06-04 ENCOUNTER — Encounter: Payer: Medicaid Other | Admitting: Obstetrics and Gynecology

## 2020-06-04 ENCOUNTER — Other Ambulatory Visit: Payer: Medicaid Other

## 2020-06-05 ENCOUNTER — Other Ambulatory Visit: Payer: Self-pay | Admitting: Obstetrics & Gynecology

## 2020-06-05 DIAGNOSIS — IMO0002 Reserved for concepts with insufficient information to code with codable children: Secondary | ICD-10-CM

## 2020-06-05 NOTE — Progress Notes (Addendum)
PATIENT ID: Susan Lara, female     DOB: 04-15-1996, 24 y.o.     MRN: [id number]     [facility] PREGNANCY VISIT PATIENT NAME: Susan Lara MRN [id number]  DOB: 1996/06/10 Chief Complaint:   Routine Prenatal Visit (Ultrasound)  History of Present Illness:   Susan Lara is a 24 y.o. G1P0 female at [redacted]w[redacted]d with an Estimated Date of Delivery: 07/23/20 being seen today for ongoing management of a high-risk pregnancy complicated by fetal growth restriction 5%.  The patient weighed 5 pounds 8 ounces when she was born at term, and her partner is 6 feet tall but quite slim body build today she reports no complaints.   She notes that the baby's father is very slim, and is actually slimmer than her despite being 6'0" tall.   Depression screen [facility] 2/9 04/17/2020 01/10/2020 11/22/2019  Decreased Interest 0 0 0  Down, Depressed, Hopeless 0 0 0  PHQ - 2 Score 0 0 0  Altered sleeping 0 0 -  Tired, decreased energy 0 0 -  Change in appetite 0 0 -  Feeling bad or failure about yourself  0 0 -  Trouble concentrating 0 0 -  Moving slowly or fidgety/restless 0 0 -  Suicidal thoughts 0 0 -  PHQ-9 Score 0 0 -  Difficult doing work/chores Not difficult at all - -   Contractions: Not present. Vag. Bleeding: None.  Movement: Present. denies leaking of fluid.   Review of Systems:   Pertinent items are noted in HPI Denies abnormal vaginal discharge w/ itching/odor/irritation, headaches, visual changes, shortness of breath, chest pain, abdominal pain, severe nausea/vomiting, or problems with urination or bowel movements unless otherwise stated above.  06/06/20 [place] 33+2 wks,cephalic,fhr 144 bpm,posterior placenta gr 3,BPP 8/8,AFI 11.8 cm,RI .69,.60,.56,.59=43%,S/D 2.4=37%  Pertinent History Reviewed:  Reviewed past medical,surgical, social, obstetrical and family history.  Reviewed problem list, medications and allergies.  Physical Assessment:   Vitals:   06/06/20 1105  BP: (!) 108/55  Pulse:  97  Weight: 119 lb 3.2 oz (54.1 kg)  Body mass index is 21.12 kg/m.           Physical Examination:   General appearance: alert, well appearing, and in no distress, oriented to person, place, and time and normal appearing weight  Mental status: alert, oriented to person, place, and time, normal mood, behavior, speech, dress, motor activity, and thought processes, affect appropriate to mood  Skin: warm & dry   Extremities: Edema: None    Cardiovascular: normal heart rate noted  Respiratory: normal respiratory effort, no distress  Abdomen: gravid, soft, non-tender   32cm  Pelvic: Cervical exam deferred         Fetal Status:     Movement: Present    Fetal Surveillance Testing today: U/S 06/06/20 [place] 33+2 wks,cephalic,fhr 144 bpm,posterior placenta gr 3,BPP 8/8,AFI 11.8 cm,RI .69,.60,.56,.59=43%,S/D 2.4=37%    Chaperone:  [employer]     Results for orders placed or performed in visit on 06/06/20 (from the past 24 hour(s))  POC Urinalysis Dipstick OB   Collection Time: 06/06/20 11:03 AM  Result Value Ref Range   Color, UA     Clarity, UA     Glucose, UA Negative Negative   Bilirubin, UA     Ketones, UA n    Spec Grav, UA     Blood, UA n    pH, UA     POC,PROTEIN,UA Negative Negative, Trace, Small (1+), Moderate (2+), Large (3+), 4+  Urobilinogen, UA     Nitrite, UA n    Leukocytes, UA Negative Negative   Appearance     Odor       Assessment & Plan:  1) High-risk pregnancy G1P0 at [redacted]w[redacted]d with an Estimated Date of Delivery: 07/23/20   2) FGR 5% with specifically reassuring fetal assessment with good BPP, fluid and excellent Doppler flow, stable  3) constitutionally petite pt and partner, stable  Meds: No orders of the defined types were placed in this encounter.   Labs/procedures today: U/S  Treatment Plan:  Continue with 1/week U/S and 1/week NST  Reviewed: Term labor symptoms and general obstetric precautions including but not limited to vaginal bleeding,  contractions, leaking of fluid and fetal movement were reviewed in detail with the patient.  All questions were answered. Check bp weekly, let us know if >140/90.   Follow-up: twice weekly testing BPP / doppler weeklly with NST alternating   By signing my name below, I, [employer], attest that this documentation has been prepared under the direction and in the presence of Tilda Burrow, MD. Electronically Signed: Mal Misty Medical Scribe. 06/06/20. 11:42 AM.  I personally performed the services described in this documentation, which was SCRIBED in my presence. The recorded information has been reviewed and considered accurate. It has been edited as necessary during review. Tilda Burrow, MD   I personally performed the services described in this documentation, which was SCRIBED in my presence. The recorded information has been reviewed and considered accurate. It has been edited as necessary during review. Tilda Burrow, MD

## 2020-06-06 ENCOUNTER — Ambulatory Visit: Payer: Medicaid Other

## 2020-06-06 ENCOUNTER — Ambulatory Visit (INDEPENDENT_AMBULATORY_CARE_PROVIDER_SITE_OTHER): Payer: Medicaid Other | Admitting: Obstetrics and Gynecology

## 2020-06-06 ENCOUNTER — Ambulatory Visit (INDEPENDENT_AMBULATORY_CARE_PROVIDER_SITE_OTHER): Payer: Medicaid Other

## 2020-06-06 VITALS — BP 108/55 | HR 97 | Wt 119.2 lb

## 2020-06-06 DIAGNOSIS — Z419 Encounter for procedure for purposes other than remedying health state, unspecified: Secondary | ICD-10-CM | POA: Diagnosis not present

## 2020-06-06 DIAGNOSIS — Z331 Pregnant state, incidental: Secondary | ICD-10-CM

## 2020-06-06 DIAGNOSIS — Z3A33 33 weeks gestation of pregnancy: Secondary | ICD-10-CM

## 2020-06-06 DIAGNOSIS — Z1389 Encounter for screening for other disorder: Secondary | ICD-10-CM

## 2020-06-06 DIAGNOSIS — IMO0002 Reserved for concepts with insufficient information to code with codable children: Secondary | ICD-10-CM

## 2020-06-06 DIAGNOSIS — O368330 Maternal care for abnormalities of the fetal heart rate or rhythm, third trimester, not applicable or unspecified: Secondary | ICD-10-CM | POA: Diagnosis not present

## 2020-06-06 DIAGNOSIS — O365930 Maternal care for other known or suspected poor fetal growth, third trimester, not applicable or unspecified: Secondary | ICD-10-CM

## 2020-06-06 LAB — POCT URINALYSIS DIPSTICK OB
Blood, UA: NEGATIVE
Glucose, UA: NEGATIVE
Ketones, UA: NEGATIVE
Leukocytes, UA: NEGATIVE
Nitrite, UA: NEGATIVE
POC,PROTEIN,UA: NEGATIVE

## 2020-06-06 NOTE — Progress Notes (Signed)
[place] 33+2 wks,cephalic,fhr 144 bpm,posterior placenta gr 3,BPP 8/8,AFI 11.8 cm,RI .69,.60,.56,.59=43%,S/D 2.4=37%

## 2020-06-06 NOTE — Patient Instructions (Signed)
[facility] at [facility] [address] [place],  [place]  [address]  Get Driving Directions  Main: [phone]    [facility] CLASSES  [facility] Call to Register: [phone] or [phone] or Register Online: [email or web address]  THESE CLASSES FILL UP VERY QUICKLY, SO SIGN UP AS SOON AS YOU CAN!!!  *Please visit [facility] pregnancy website at [email or web address]*  Option 1: Birth & Baby Series  This series of 3 weekly classes helps you and your labor partner prepare for childbirth at [facility].  Reviews newborn care, labor & birth, pain management, and comfort techniques  Maternity Care Center Tour of [facility] is included.  Cost: $60 per couple for insured or self-pay, $30 per couple for Medicaid  Option 2: Weekend Birth & Baby  This class is a weekend version of our Birth & Baby series. It is designed for parents who have a difficult time fitting several weeks of classes into their schedule.   Maternity Care Center Tour of [facility] is included.   Friday 6:30pm-8:30pm, Saturday 9am-4pm  Cost: $75 per couple for insured or self-pay, $30 per couple for Medicaid  Option 3: Natural Childbirth  This series of 5 weekly classes is for expectant parents who want to learn and practice natural methods of coping with the process of labor and childbirth.  Maternity Care Center Tour of [facility] is included.  Cost: $75 per couple for insured or self-pay, $30 per couple for Medicaid  Option 4: Online Birth & Baby  This online class offers you the freedom to complete a Birth & Baby series in the comfort of your own home. The flexibility of this option allows you to review sections at your own place, at times convenient to you and your support people.   Cost: $60 for 60 days of online access    Other Available Classes Baby & Me Enjoy this time to discuss  newborn & infant parenting topics and family adjustment issues with other new mothers in a relaxed environment. Each week brings a new speaker or baby-centered activity. We encourage mothers and their babies (birth to crawling) to join [place] every Thursday in the [facility] Education Center at 11:00 am. You are welcome to visit this group even if you haven't delivered yet! It's wonderful to make new friends early and watch other moms interact with their babies. No registration or fee.   Big Brother/ Big Sister Let your children share in the joy of a new brother or sister in this special class designed just for them. This class is designed for children ages 2-6, but any age is welcome. Please register each child individually.  Breastfeeding Support Group This group is a mother-to-mother support circle where moms have the opportunity to share their breastfeeding experiences. A breastfeeding Support nurse is present for questions and concerns. Meets each Tuesday at 11:00 am. No fee or registration.  Breastfeeding Your Baby Breastfeeding is a special time for mother and child. This class will help you feel ready to begin this important relationship. Your partner is encouraged to attend with you.   Caring For Baby This class is for expectant  and adoptive parents who want to learn and practice the most up-to-date newborn care for their babies. Register only the mom-to-be and your partner can come with you. (Note: This class is included in the Birth & Baby series and the Weekend Birth & Baby classes.)  Comfort Techniques & Tour This  2-hour interactive class will provide you the opportunity to learn & practice hands-on techniques with your partner that can help relieve some discomfort of labor and encourage your baby to rotate toward the best position for birth. A tour of the [facility] is included.   Daddy [employer] This course offers Dads-to-be the tools and knowledge  needed to feel confident on their journey to becoming new fathers.  Grandparent Love Expecting a grandbaby? Learn about the latest infant care and safety recommendation and ways to support your own child as he or she transitions into the parenting role.   Infant and Child CPR Parents, grandparents, babysitters, and friends learn Cardio-Pulmonary Resuscitation skills for infants and children. Register each participant individually. (Note: This Family & Friends program does not offer certification.)  Marvelous Multiples Expecting twins, triplets, or more? This class covers the differences in labor, birth, parenting, and breastfeeding issues that face multiples' parents. NICU tour is included.   Mom Talk This mom-led group offers support and connection to mothers as they journey through the adjustments and struggles of that sometimes overwhelming first year after the birth of a child. A member of our [facility] staff will be present to share resources and additional support if needed, as you care for yourself and baby. You are welcome to visit the group before you deliver! It's wonderful to meet new friends early and watch other moms interact with their babies. It's held at [facility] at 10:00 am each Tuesday morning and 6:00 pm each Thursday evening. Babies (birth to crawling) welcome. No registration or fee.   Waterbirth Classes Interested in a waterbirth? This informational class will help you discover whether waterbirth is the right fir for you.   [facility] International Paper Tour View a virtual tour of [facility]. In-person tours are available for participants of childbirth education classes.

## 2020-06-11 ENCOUNTER — Other Ambulatory Visit: Payer: Medicaid Other

## 2020-06-13 ENCOUNTER — Other Ambulatory Visit: Payer: Self-pay | Admitting: Obstetrics & Gynecology

## 2020-06-13 ENCOUNTER — Other Ambulatory Visit: Payer: Medicaid Other

## 2020-06-13 DIAGNOSIS — O365921 Maternal care for other known or suspected poor fetal growth, second trimester, fetus 1: Secondary | ICD-10-CM

## 2020-06-14 ENCOUNTER — Ambulatory Visit (INDEPENDENT_AMBULATORY_CARE_PROVIDER_SITE_OTHER): Payer: Medicaid Other | Admitting: Women's Health

## 2020-06-14 ENCOUNTER — Encounter: Payer: Self-pay | Admitting: Women's Health

## 2020-06-14 ENCOUNTER — Ambulatory Visit (INDEPENDENT_AMBULATORY_CARE_PROVIDER_SITE_OTHER): Payer: Medicaid Other

## 2020-06-14 VITALS — BP 88/60 | HR 85 | Wt 120.0 lb

## 2020-06-14 DIAGNOSIS — Z3A34 34 weeks gestation of pregnancy: Secondary | ICD-10-CM

## 2020-06-14 DIAGNOSIS — Z3403 Encounter for supervision of normal first pregnancy, third trimester: Secondary | ICD-10-CM

## 2020-06-14 DIAGNOSIS — O365921 Maternal care for other known or suspected poor fetal growth, second trimester, fetus 1: Secondary | ICD-10-CM

## 2020-06-14 DIAGNOSIS — O365931 Maternal care for other known or suspected poor fetal growth, third trimester, fetus 1: Secondary | ICD-10-CM | POA: Diagnosis not present

## 2020-06-14 NOTE — Progress Notes (Signed)
[place] 34+3 wks,cephalic,posterior placenta gr 3,afi 15 cm,BPP 8/8,FHR 146 bpm,EFW 2189 g 19.5%

## 2020-06-14 NOTE — Progress Notes (Signed)
   LOW-RISK PREGNANCY VISIT Patient name: Susan Lara MRN [id number]  Date of birth: 01-21-1996 Chief Complaint:   Routine Prenatal Visit (U/S)  History of Present Illness:   Susan Lara is a 24 y.o. G1P0 female at [redacted]w[redacted]d with an Estimated Date of Delivery: 07/23/20 being seen today for ongoing management of a low-risk pregnancy.  Depression screen [facility] 2/9 04/17/2020 01/10/2020 11/22/2019  Decreased Interest 0 0 0  Down, Depressed, Hopeless 0 0 0  PHQ - 2 Score 0 0 0  Altered sleeping 0 0 -  Tired, decreased energy 0 0 -  Change in appetite 0 0 -  Feeling bad or failure about yourself  0 0 -  Trouble concentrating 0 0 -  Moving slowly or fidgety/restless 0 0 -  Suicidal thoughts 0 0 -  PHQ-9 Score 0 0 -  Difficult doing work/chores Not difficult at all - -    Today she reports no complaints. Contractions: Not present. Vag. Bleeding: None.  Movement: Present. denies leaking of fluid. Review of Systems:   Pertinent items are noted in HPI Denies abnormal vaginal discharge w/ itching/odor/irritation, headaches, visual changes, shortness of breath, chest pain, abdominal pain, severe nausea/vomiting, or problems with urination or bowel movements unless otherwise stated above. Pertinent History Reviewed:  Reviewed past medical,surgical, social, obstetrical and family history.  Reviewed problem list, medications and allergies. Physical Assessment:   Vitals:   06/14/20 0958  BP: (!) 88/60  Pulse: 85  Weight: 120 lb (54.4 kg)  Body mass index is 21.26 kg/m.        Physical Examination:   General appearance: Well appearing, and in no distress  Mental status: Alert, oriented to person, place, and time  Skin: Warm & dry  Cardiovascular: Normal heart rate noted  Respiratory: Normal respiratory effort, no distress  Abdomen: Soft, gravid, nontender  Pelvic: Cervical exam deferred         Extremities: Edema: None  Fetal Status:     Movement: Present   [place] 34+3  wks,cephalic,posterior placenta gr 3,afi 15 cm,BPP 8/8,FHR 146 bpm,EFW 2189 g 19.5%  Chaperone: n/a    No results found for this or any previous visit (from the past 24 hour(s)).  Assessment & Plan:  1) Low-risk pregnancy G1P0 at [redacted]w[redacted]d with an Estimated Date of Delivery: 07/23/20   2) Previous FGR, 5% w/ AC 15% w/ MFM @ 30wks, has had normal testing since, Amber did measurements today, now all normal- EFW 19.5% w/ AC 39%, reviewed w/ LHE-no longer need to tx as FGR, will repeat EFW in 3wks   Meds: No orders of the defined types were placed in this encounter.  Labs/procedures today: u/s  Plan:  Continue routine obstetrical care  Next visit: prefers will be in person for gbs    Reviewed: Preterm labor symptoms and general obstetric precautions including but not limited to vaginal bleeding, contractions, leaking of fluid and fetal movement were reviewed in detail with the patient.  All questions were answered. Has home bp cuff.  Check bp weekly, let us know if >140/90.   Follow-up: Return in about 2 weeks (around 06/28/2020) for LROB, MD or CNM, in person; then 3wks from now for EFW/LROB.  No orders of the defined types were placed in this encounter.  Cheral Marker CNM, [facility] 06/14/2020 10:31 AM

## 2020-06-14 NOTE — Patient Instructions (Signed)
Joline Maxcy, I greatly value your feedback.  If you receive a survey following your visit with [place] today, we appreciate you taking the time to fill it out.  Thanks, Joellyn Haff, CNM, WHNP-BC  [facility] at [facility] ([address] [place], [place] [address]) Entrance C, located off of [address] valet parking   Go to [email or web address] to register for FREE online childbirth classes    Call the office [phone]) or go to [facility] if:  You begin to have strong, frequent contractions  Your water breaks.  Sometimes it is a big gush of fluid, sometimes it is just a trickle that keeps getting your panties wet or running down your legs  You have vaginal bleeding.  It is normal to have a small amount of spotting if your cervix was checked.   You don't feel your baby moving like normal.  If you don't, get you something to eat and drink and lay down and focus on feeling your baby move.  You should feel at least 10 movements in 2 hours.  If you don't, you should call the office or go to [facility].   Call the office [phone]) or go to [facility] hospital for these signs of pre-eclampsia:  Severe headache that does not go away with Tylenol  Visual changes- seeing spots, double, blurred vision  Pain under your right breast or upper abdomen that does not go away with Tums or heartburn medicine  Nausea and/or vomiting  Severe swelling in your hands, feet, and face    Home Blood Pressure Monitoring for Patients   Your provider has recommended that you check your blood pressure (BP) at least once a week at home. If you do not have a blood pressure cuff at home, one will be provided for you. Contact your provider if you have not received your monitor within 1 week.   Helpful Tips for Accurate Home Blood Pressure Checks  . Don't smoke, exercise, or drink caffeine 30 minutes before checking your BP . Use the restroom before checking your BP (a full  bladder can raise your pressure) . Relax in a comfortable upright chair . Feet on the ground . Left arm resting comfortably on a flat surface at the level of your heart . Legs uncrossed . Back supported . Sit quietly and don't talk . Place the cuff on your bare arm . Adjust snuggly, so that only two fingertips can fit between your skin and the top of the cuff . Check 2 readings separated by at least one minute . Keep a log of your BP readings . For a visual, please reference this diagram: http://ccnc.care/bpdiagram  Provider Name: [facility]     Phone: [phone]  Zone 1: ALL CLEAR  Continue to monitor your symptoms:  . BP reading is less than 140 (top number) or less than 90 (bottom number)  . No right upper stomach pain . No headaches or seeing spots . No feeling nauseated or throwing up . No swelling in face and hands  Zone 2: CAUTION Call your doctor's office for any of the following:  . BP reading is greater than 140 (top number) or greater than 90 (bottom number)  . Stomach pain under your ribs in the middle or right side . Headaches or seeing spots . Feeling nauseated or throwing up . Swelling in face and hands  Zone 3: EMERGENCY  Seek immediate medical care if you have any of  the following:  . BP reading is greater than160 (top number) or greater than 110 (bottom number) . Severe headaches not improving with Tylenol . Serious difficulty catching your breath . Any worsening symptoms from Zone 2  Preterm Labor and Birth Information  The normal length of a pregnancy is 39-41 weeks. Preterm labor is when labor starts before 37 completed weeks of pregnancy. What are the risk factors for preterm labor? Preterm labor is more likely to occur in women who:  Have certain infections during pregnancy such as a bladder infection, sexually transmitted infection, or infection inside the uterus (chorioamnionitis).  Have a shorter-than-normal cervix.  Have gone into  preterm labor before.  Have had surgery on their cervix.  Are younger than age 54 or older than age 25.  Are African American.  Are pregnant with twins or multiple babies (multiple gestation).  Take street drugs or smoke while pregnant.  Do not gain enough weight while pregnant.  Became pregnant shortly after having been pregnant. What are the symptoms of preterm labor? Symptoms of preterm labor include:  Cramps similar to those that can happen during a menstrual period. The cramps may happen with diarrhea.  Pain in the abdomen or lower back.  Regular uterine contractions that may feel like tightening of the abdomen.  A feeling of increased pressure in the pelvis.  Increased watery or bloody mucus discharge from the vagina.  Water breaking (ruptured amniotic sac). Why is it important to recognize signs of preterm labor? It is important to recognize signs of preterm labor because babies who are born prematurely may not be fully developed. This can put them at an increased risk for:  Long-term (chronic) heart and lung problems.  Difficulty immediately after birth with regulating body systems, including blood sugar, body temperature, heart rate, and breathing rate.  Bleeding in the brain.  Cerebral palsy.  Learning difficulties.  Death. These risks are highest for babies who are born before 48 weeks of pregnancy. How is preterm labor treated? Treatment depends on the length of your pregnancy, your condition, and the health of your baby. It may involve: 1. Having a stitch (suture) placed in your cervix to prevent your cervix from opening too early (cerclage). 2. Taking or being given medicines, such as: ? Hormone medicines. These may be given early in pregnancy to help support the pregnancy. ? Medicine to stop contractions. ? Medicines to help mature the baby's lungs. These may be prescribed if the risk of delivery is high. ? Medicines to prevent your baby from  developing cerebral palsy. If the labor happens before 34 weeks of pregnancy, you may need to stay in the hospital. What should I do if I think I am in preterm labor? If you think that you are going into preterm labor, call your health care provider right away. How can I prevent preterm labor in future pregnancies? To increase your chance of having a full-term pregnancy:  Do not use any tobacco products, such as cigarettes, chewing tobacco, and e-cigarettes. If you need help quitting, ask your health care provider.  Do not use street drugs or medicines that have not been prescribed to you during your pregnancy.  Talk with your health care provider before taking any herbal supplements, even if you have been taking them regularly.  Make sure you gain a healthy amount of weight during your pregnancy.  Watch for infection. If you think that you might have an infection, get it checked right away.  Make sure to  tell your health care provider if you have gone into preterm labor before. This information is not intended to replace advice given to you by your health care provider. Make sure you discuss any questions you have with your health care provider. Document Revised: 03/17/2019 Document Reviewed: 04/15/2016 Elsevier Patient Education  [place].

## 2020-06-16 ENCOUNTER — Encounter (HOSPITAL_COMMUNITY): Payer: Self-pay | Admitting: Obstetrics and Gynecology

## 2020-06-16 ENCOUNTER — Inpatient Hospital Stay (HOSPITAL_COMMUNITY)
Admission: AD | Admit: 2020-06-16 | Discharge: 2020-06-17 | Disposition: A | Discharge status: Home / Self Care | Payer: Medicaid Other | Attending: Obstetrics and Gynecology | Admitting: Obstetrics and Gynecology

## 2020-06-16 ENCOUNTER — Other Ambulatory Visit: Payer: Self-pay

## 2020-06-16 DIAGNOSIS — O26899 Other specified pregnancy related conditions, unspecified trimester: Secondary | ICD-10-CM

## 2020-06-16 DIAGNOSIS — O23593 Infection of other part of genital tract in pregnancy, third trimester: Secondary | ICD-10-CM | POA: Insufficient documentation

## 2020-06-16 DIAGNOSIS — Z87442 Personal history of urinary calculi: Secondary | ICD-10-CM | POA: Insufficient documentation

## 2020-06-16 DIAGNOSIS — B373 Candidiasis of vulva and vagina: Secondary | ICD-10-CM

## 2020-06-16 DIAGNOSIS — R1031 Right lower quadrant pain: Secondary | ICD-10-CM | POA: Insufficient documentation

## 2020-06-16 DIAGNOSIS — B3731 Acute candidiasis of vulva and vagina: Secondary | ICD-10-CM

## 2020-06-16 DIAGNOSIS — O26893 Other specified pregnancy related conditions, third trimester: Secondary | ICD-10-CM | POA: Insufficient documentation

## 2020-06-16 DIAGNOSIS — Z3A34 34 weeks gestation of pregnancy: Secondary | ICD-10-CM | POA: Insufficient documentation

## 2020-06-16 DIAGNOSIS — R109 Unspecified abdominal pain: Secondary | ICD-10-CM

## 2020-06-16 LAB — URINALYSIS, ROUTINE W REFLEX MICROSCOPIC
Bacteria, UA: NONE SEEN
Bilirubin Urine: NEGATIVE
Glucose, UA: NEGATIVE mg/dL
Hgb urine dipstick: NEGATIVE
Ketones, ur: NEGATIVE mg/dL
Nitrite: NEGATIVE
Protein, ur: 30 mg/dL — AB
Specific Gravity, Urine: 1.027 (ref 1.005–1.030)
pH: 7 (ref 5.0–8.0)

## 2020-06-16 NOTE — MAU Note (Signed)
Pt here with reports of constant RLQ cramping that started at 9:40pm. Reports it came out of nowhere. Has not tried anything for pain. Rates 8/10. Pt denies LOF, vaginal bleeding or discharge. Pt denies urinary symptoms.

## 2020-06-17 DIAGNOSIS — O26893 Other specified pregnancy related conditions, third trimester: Secondary | ICD-10-CM | POA: Diagnosis not present

## 2020-06-17 DIAGNOSIS — B373 Candidiasis of vulva and vagina: Secondary | ICD-10-CM

## 2020-06-17 DIAGNOSIS — Z3A34 34 weeks gestation of pregnancy: Secondary | ICD-10-CM | POA: Diagnosis not present

## 2020-06-17 DIAGNOSIS — R1031 Right lower quadrant pain: Secondary | ICD-10-CM | POA: Diagnosis not present

## 2020-06-17 DIAGNOSIS — O23593 Infection of other part of genital tract in pregnancy, third trimester: Secondary | ICD-10-CM | POA: Diagnosis not present

## 2020-06-17 DIAGNOSIS — Z87442 Personal history of urinary calculi: Secondary | ICD-10-CM | POA: Diagnosis not present

## 2020-06-17 DIAGNOSIS — R109 Unspecified abdominal pain: Secondary | ICD-10-CM | POA: Diagnosis present

## 2020-06-17 MED ORDER — TERCONAZOLE 0.4 % VA CREA: 1.0000 | TOPICAL_CREAM | Freq: Every day | VAGINAL | 0 refills | Status: AC

## 2020-06-17 NOTE — Discharge Instructions (Signed)
Alternative Vaginitis Therapies  1) soak in tub of warm water waist high with 1/2 cup of baking soda in water for ~ 20 mins. 2) soak 3 tampons in 1 tablespoon of fractionated (liquid form) coconut oil with 10 drops of Melaleuca (Tea Tree) essential oil, insert 1 saturated tampon vaginally at bedtime x 3 days. Both options are to be done after sexual intercourse, menses and when suspects Bacterial Vaginosis and/or yeast infection. Please be advised that these alternatives will not replace the need to be evaluated, if symptoms persist. You will need to seek care at an OB/GYN provider.  GO WHITE: Soap: UNSCENTED Dove (white box light green writing) Laundry detergent (underwear)- Dreft or [employer] unscented WHITE 100% cotton panties (NOT just cotton crouch) Sanitary napkin/panty liners: UNSCENTED.  If it doesn't SAY unscented it can have a scent/perfume    NO PERFUMES OR LOTIONS OR POTIONS in the vulvar area (may use regular [place]) Condoms: hypoallergenic only. Non dyed (no color) Toilet papers: white only Wash clothes: use a separate wash cloth. WHITE.  Wash in Dreft.   You can purchase Tea Tree Oil locally at:  [employer] [address] [place], [place] [address] [phone]  [employer] [address] [place], [place] [address] [phone]  

## 2020-06-17 NOTE — MAU Provider Note (Signed)
History     CSN: [id number]  Arrival date and time: 06/16/20 2225   First Provider Initiated Contact with Patient 06/16/20 2345      Chief Complaint  Patient presents with  . Abdominal Pain   Susan Lara is a 24 y.o. year old G1P0 female at [redacted]w[redacted]d weeks gestation who presents to MAU reporting right lower quadrant cramping since 9:40 PM.  She states the pain "came out of nowhere".  She has not tried any medication for the pain; pain rated 8 out of 10.  She denies any loss of fluid vaginal bleeding or abnormal discharge.  She also denies any urinary symptoms.  She states that the pain is now 0 upon arrival to MAU; which was approximately 10:30 PM.  She has a history of right kidney stone that was diagnosed at 29 weeks and she reports that this pain is not the same as that pain she had with a kidney stone.  [facility] on [facility] 14, 2020; her next appointment is June 28, 2020.   OB History    Gravida  1   Para      Term      Preterm      AB      Living        SAB      TAB      Ectopic      Multiple      Live Births              Past Medical History:  Diagnosis Date  . Medical history non-contributory     Past Surgical History:  Procedure Laterality Date  . ESOPHAGEAL MANOMETRY N/A 03/07/2018   Procedure: ESOPHAGEAL MANOMETRY (EM);  Surgeon: Jeani Hawking, MD;  Location: [facility];  Service: Endoscopy;  Laterality: N/A;  . SINUS EXPLORATION  2011    Family History  Problem Relation Age of Onset  . Cancer Paternal Grandmother        breast  . Cancer Maternal Grandfather        brain    Social History   Tobacco Use  . Smoking status: Never Smoker  . Smokeless tobacco: Never Used  Vaping Use  . Vaping Use: Never used  Substance Use Topics  . Alcohol use: Not Currently  . Drug use: Never    Allergies: No Known Allergies  Medications Prior to Admission  Medication Sig Dispense Refill  Last Dose  . Prenatal Vit-Iron Carbonyl-FA (PRENATAL PLUS IRON) 29-1 MG TABS Take 1 tablet by mouth daily. 30 tablet 12 06/15/2020 at Unknown time  . Blood Pressure Monitor MISC For regular home bp monitoring during pregnancy 1 each 0   . Doxylamine-Pyridoxine 10-10 MG TBEC Take 2 tablets by mouth at bedtime. Two tabs at bedtime, Increase by one tablet in the morning, and one at lunch, as needed. (Patient not taking: Reported on 06/06/2020) 60 tablet 3     Review of Systems  Constitutional: Negative.   HENT: Negative.   Eyes: Negative.   Respiratory: Negative.   Cardiovascular: Negative.   Gastrointestinal: Negative.   Endocrine: Negative.   Genitourinary: Negative.   Musculoskeletal: Negative.   Skin: Negative.   Allergic/Immunologic: Negative.   Neurological: Negative.   Hematological: Negative.   Psychiatric/Behavioral: Negative.    Physical Exam   Blood pressure (!) 96/53, pulse 80, temperature 97.7 F (36.5 C), temperature source Oral, resp. rate 18, height 5' 2.5" (1.588 m),  weight 55.1 kg, last menstrual period 10/17/2019.  Physical Exam Vitals and nursing note reviewed. Exam conducted with a chaperone present.  Constitutional:      Appearance: She is well-developed and normal weight.  HENT:     Head: Normocephalic and atraumatic.  Cardiovascular:     Rate and Rhythm: Normal rate.  Pulmonary:     Effort: Pulmonary effort is normal.  Genitourinary:    Comments: Uterus: gravid, S=D  SE: declined  Dilation: Closed Effacement (%): 50 Cervical Position: Posterior Station: -2 Presentation: Vertex Exam by: Carloyn Jaeger, CNM  Moderate amount of yeast-like discharge on exam glove Neurological:     Mental Status: She is alert.    REACTIVE NST - FHR: 130 bpm / moderate variability / accels present / decels absent / TOCO: Occ UI noted MAU Course  Procedures  MDM CCUA Wet Prep -- pt declined GC/CT -- pt declined Digital Cervical Exam  Results for orders placed or  performed during the hospital encounter of 06/16/20 (from the past 24 hour(s))  Urinalysis, Routine w reflex microscopic     Status: Abnormal   Collection Time: 06/16/20 10:31 PM  Result Value Ref Range   Color, Urine YELLOW YELLOW   APPearance CLEAR CLEAR   Specific Gravity, Urine 1.027 1.005 - 1.030   pH 7.0 5.0 - 8.0   Glucose, UA NEGATIVE NEGATIVE mg/dL   Hgb urine dipstick NEGATIVE NEGATIVE   Bilirubin Urine NEGATIVE NEGATIVE   Ketones, ur NEGATIVE NEGATIVE mg/dL   Protein, ur 30 (A) NEGATIVE mg/dL   Nitrite NEGATIVE NEGATIVE   Leukocytes,Ua SMALL (A) NEGATIVE   RBC / HPF 0-5 0 - 5 RBC/hpf   WBC, UA 0-5 0 - 5 WBC/hpf   Bacteria, UA NONE SEEN NONE SEEN   Squamous Epithelial / LPF 0-5 0 - 5   Mucus PRESENT     Assessment and Plan  Abdominal cramping affecting pregnancy - Resolved shortly after arrival to MAU  - Information provided on abd pain pregnancy   Vaginal yeast infection - Information provided on vaginal yeast infection - Rx for Terazol 0.4% cream applied vaginally hs x 7 days - Alternative vaginal therapies given  - Discharge patient - Keep scheduled appt with [facility] on 06/28/2020 - Patient verbalized an understanding of the plan of care and agrees.     Raelyn Mora, MSN, CNM 06/17/2020, 12:18 AM

## 2020-06-18 ENCOUNTER — Other Ambulatory Visit: Payer: Medicaid Other

## 2020-06-21 ENCOUNTER — Other Ambulatory Visit: Payer: Medicaid Other

## 2020-06-21 ENCOUNTER — Encounter: Payer: Medicaid Other | Admitting: Advanced Practice Midwife

## 2020-06-22 ENCOUNTER — Other Ambulatory Visit: Payer: Self-pay

## 2020-06-22 ENCOUNTER — Inpatient Hospital Stay (HOSPITAL_COMMUNITY)
Admission: AD | Admit: 2020-06-22 | Discharge: 2020-06-22 | Disposition: A | Discharge status: Home / Self Care | Payer: Medicaid Other | Attending: Obstetrics & Gynecology | Admitting: Obstetrics & Gynecology

## 2020-06-22 ENCOUNTER — Encounter (HOSPITAL_COMMUNITY): Payer: Self-pay | Admitting: Obstetrics & Gynecology

## 2020-06-22 DIAGNOSIS — Z3A35 35 weeks gestation of pregnancy: Secondary | ICD-10-CM | POA: Insufficient documentation

## 2020-06-22 DIAGNOSIS — D649 Anemia, unspecified: Secondary | ICD-10-CM

## 2020-06-22 DIAGNOSIS — O99013 Anemia complicating pregnancy, third trimester: Secondary | ICD-10-CM | POA: Diagnosis not present

## 2020-06-22 LAB — COMPREHENSIVE METABOLIC PANEL
ALT: 11 U/L (ref 0–44)
AST: 23 U/L (ref 15–41)
Albumin: 2.7 g/dL — ABNORMAL LOW (ref 3.5–5.0)
Alkaline Phosphatase: 139 U/L — ABNORMAL HIGH (ref 38–126)
Anion gap: 9 (ref 5–15)
BUN: 7 mg/dL (ref 6–20)
CO2: 21 mmol/L — ABNORMAL LOW (ref 22–32)
Calcium: 8.5 mg/dL — ABNORMAL LOW (ref 8.9–10.3)
Chloride: 105 mmol/L (ref 98–111)
Creatinine, Ser: 0.68 mg/dL (ref 0.44–1.00)
GFR calc Af Amer: >60 mL/min (ref >60)
GFR calc non Af Amer: >60 mL/min (ref >60)
Glucose, Bld: 96 mg/dL (ref 70–99)
Potassium: 4 mmol/L (ref 3.5–5.1)
Sodium: 135 mmol/L (ref 135–145)
Total Bilirubin: 0.9 mg/dL (ref 0.3–1.2)
Total Protein: 5.7 g/dL — ABNORMAL LOW (ref 6.5–8.1)

## 2020-06-22 LAB — URINALYSIS, ROUTINE W REFLEX MICROSCOPIC
Bacteria, UA: NONE SEEN
Bilirubin Urine: NEGATIVE
Glucose, UA: 50 mg/dL — AB
Hgb urine dipstick: NEGATIVE
Ketones, ur: 20 mg/dL — AB
Nitrite: NEGATIVE
Protein, ur: 30 mg/dL — AB
Specific Gravity, Urine: 1.026 (ref 1.005–1.030)
pH: 6 (ref 5.0–8.0)

## 2020-06-22 LAB — CBC
HCT: 31.6 % — ABNORMAL LOW (ref 36.0–46.0)
Hemoglobin: 10.5 g/dL — ABNORMAL LOW (ref 12.0–15.0)
MCH: 28.4 pg (ref 26.0–34.0)
MCHC: 33.2 g/dL (ref 30.0–36.0)
MCV: 85.4 fL (ref 80.0–100.0)
Platelets: 231 10*3/uL (ref 150–400)
RBC: 3.7 MIL/uL — ABNORMAL LOW (ref 3.87–5.11)
RDW: 13.5 % (ref 11.5–15.5)
WBC: 10.5 10*3/uL (ref 4.0–10.5)
nRBC: 0 % (ref 0.0–0.2)

## 2020-06-22 MED ORDER — FERROUS SULFATE 325 (65 FE) MG PO TABS
325.0000 mg | ORAL_TABLET | Freq: Every day | ORAL | 0 refills | Status: DC
Start: 2020-06-22 — End: 2021-01-22

## 2020-06-22 MED ORDER — DOCUSATE SODIUM 250 MG PO CAPS
250.0000 mg | ORAL_CAPSULE | Freq: Every day | ORAL | 0 refills | Status: DC
Start: 2020-06-22 — End: 2021-01-22

## 2020-06-22 NOTE — MAU Note (Signed)
Was sitting on toilet and there was cramping all over my body, happened at 1:50pm.  Stayed for a little while and then when I got in car, it went away.  No bleeding. No leaking. Baby moving well.

## 2020-06-22 NOTE — Discharge Instructions (Signed)

## 2020-06-22 NOTE — MAU Provider Note (Signed)
History     CSN: [id number]  Arrival date and time: 06/22/20 1527   First Provider Initiated Contact with Patient 06/22/20 1605      Chief Complaint  Patient presents with  . Abdominal Pain   HPI Susan Lara is a 24 y.o. G1P0 at [redacted]w[redacted]d who presents stating she was on the toilet and had one all over body cramp. She has no more pain and the cramp never happened again. She denies any vaginal bleeding or leaking of fluid. Reports normal fetal movement.   OB History    Gravida  1   Para      Term      Preterm      AB      Living        SAB      TAB      Ectopic      Multiple      Live Births              Past Medical History:  Diagnosis Date  . Medical history non-contributory     Past Surgical History:  Procedure Laterality Date  . ESOPHAGEAL MANOMETRY N/A 03/07/2018   Procedure: ESOPHAGEAL MANOMETRY (EM);  Surgeon: Jeani Hawking, MD;  Location: [facility];  Service: Endoscopy;  Laterality: N/A;  . SINUS EXPLORATION  2011    Family History  Problem Relation Age of Onset  . Cancer Paternal Grandmother        breast  . Cancer Maternal Grandfather        brain    Social History   Tobacco Use  . Smoking status: Never Smoker  . Smokeless tobacco: Never Used  Vaping Use  . Vaping Use: Never used  Substance Use Topics  . Alcohol use: Not Currently  . Drug use: Never    Allergies: No Known Allergies  Medications Prior to Admission  Medication Sig Dispense Refill Last Dose  . Doxylamine-Pyridoxine 10-10 MG TBEC Take 2 tablets by mouth at bedtime. Two tabs at bedtime, Increase by one tablet in the morning, and one at lunch, as needed. 60 tablet 3 Past Month at Unknown time  . Prenatal Vit-Iron Carbonyl-FA (PRENATAL PLUS IRON) 29-1 MG TABS Take 1 tablet by mouth daily. 30 tablet 12 06/22/2020 at Unknown time  . terconazole (TERAZOL 7) 0.4 % vaginal cream Place 1 applicator vaginally at bedtime for 7 days. 45 g 0 06/21/2020 at Unknown time  .  Blood Pressure Monitor MISC For regular home bp monitoring during pregnancy 1 each 0     Review of Systems  Constitutional: Negative.  Negative for fatigue and fever.  HENT: Negative.   Respiratory: Negative.  Negative for shortness of breath.   Cardiovascular: Negative.  Negative for chest pain.  Gastrointestinal: Positive for abdominal pain. Negative for constipation, diarrhea, nausea and vomiting.  Genitourinary: Negative.  Negative for dysuria, vaginal bleeding and vaginal discharge.  Neurological: Negative.  Negative for dizziness and headaches.   Physical Exam   Blood pressure (!) 99/54, pulse 68, temperature 98.2 F (36.8 C), resp. rate 16, height 5' 2.5" (1.588 m), weight 54.4 kg, last menstrual period 10/17/2019, SpO2 97 %.  Physical Exam Vitals and nursing note reviewed.  Constitutional:      General: She is not in acute distress.    Appearance: She is well-developed.  HENT:     Head: Normocephalic.  Eyes:     Pupils: Pupils are equal, round, and reactive to light.  Cardiovascular:     Rate  and Rhythm: Normal rate and regular rhythm.     Heart sounds: Normal heart sounds.  Pulmonary:     Effort: Pulmonary effort is normal. No respiratory distress.     Breath sounds: Normal breath sounds.  Abdominal:     General: Bowel sounds are normal. There is no distension.     Palpations: Abdomen is soft.     Tenderness: There is no abdominal tenderness.  Skin:    General: Skin is warm and dry.  Neurological:     Mental Status: She is alert and oriented to person, place, and time.  Psychiatric:        Behavior: Behavior normal.        Thought Content: Thought content normal.        Judgment: Judgment normal.    Fetal Tracing:  Baseline: 130 Variability: moderate Accels: 15x15 Decels: none  Toco: none  Cervix: closed/thick/posterior  MAU Course  Procedures Results for orders placed or performed during the hospital encounter of 06/22/20 (from the past 24 hour(s))   Urinalysis, Routine w reflex microscopic     Status: Abnormal   Collection Time: 06/22/20  3:52 PM  Result Value Ref Range   Color, Urine YELLOW YELLOW   APPearance HAZY (A) CLEAR   Specific Gravity, Urine 1.026 1.005 - 1.030   pH 6.0 5.0 - 8.0   Glucose, UA 50 (A) NEGATIVE mg/dL   Hgb urine dipstick NEGATIVE NEGATIVE   Bilirubin Urine NEGATIVE NEGATIVE   Ketones, ur 20 (A) NEGATIVE mg/dL   Protein, ur 30 (A) NEGATIVE mg/dL   Nitrite NEGATIVE NEGATIVE   Leukocytes,Ua MODERATE (A) NEGATIVE   RBC / HPF 0-5 0 - 5 RBC/hpf   WBC, UA 6-10 0 - 5 WBC/hpf   Bacteria, UA NONE SEEN NONE SEEN   Squamous Epithelial / LPF 11-20 0 - 5   Mucus PRESENT   CBC     Status: Abnormal   Collection Time: 06/22/20  4:34 PM  Result Value Ref Range   WBC 10.5 4.0 - 10.5 K/uL   RBC 3.70 (L) 3.87 - 5.11 MIL/uL   Hemoglobin 10.5 (L) 12.0 - 15.0 g/dL   HCT 81.4 (L) 36 - 46 %   MCV 85.4 80.0 - 100.0 fL   MCH 28.4 26.0 - 34.0 pg   MCHC 33.2 30.0 - 36.0 g/dL   RDW 48.1 85.6 - 31.4 %   Platelets 231 150 - 400 K/uL   nRBC 0.0 0.0 - 0.2 %  Comprehensive metabolic panel     Status: Abnormal   Collection Time: 06/22/20  4:34 PM  Result Value Ref Range   Sodium 135 135 - 145 mmol/L   Potassium 4.0 3.5 - 5.1 mmol/L   Chloride 105 98 - 111 mmol/L   CO2 21 (L) 22 - 32 mmol/L   Glucose, Bld 96 70 - 99 mg/dL   BUN 7 6 - 20 mg/dL   Creatinine, Ser 9.70 0.44 - 1.00 mg/dL   Calcium 8.5 (L) 8.9 - 10.3 mg/dL   Total Protein 5.7 (L) 6.5 - 8.1 g/dL   Albumin 2.7 (L) 3.5 - 5.0 g/dL   AST 23 15 - 41 U/L   ALT 11 0 - 44 U/L   Alkaline Phosphatase 139 (H) 38 - 126 U/L   Total Bilirubin 0.9 0.3 - 1.2 mg/dL   GFR calc non Af Amer >60 >60 mL/min   GFR calc Af Amer >60 >60 mL/min   Anion gap 9 5 - 15   MDM  UA, UC CBC, CMP  Assessment and Plan   1. Anemia, unspecified type   2. [redacted] weeks gestation of pregnancy    -Discharge home in stable condition -Rx for iron and colace sent to patient's pharmacy -preterm  labor precautions discussed -Patient advised to follow-up with OB as scheduled for prenatal care -Patient may return to MAU as needed or if her condition were to change or worsen   Rolm Bookbinder CNM 06/22/2020, 5:43 PM

## 2020-06-23 LAB — CULTURE, OB URINE: Culture: NO GROWTH

## 2020-06-25 ENCOUNTER — Other Ambulatory Visit: Payer: Medicaid Other

## 2020-06-28 ENCOUNTER — Ambulatory Visit (INDEPENDENT_AMBULATORY_CARE_PROVIDER_SITE_OTHER): Payer: Medicaid Other | Admitting: Obstetrics and Gynecology

## 2020-06-28 ENCOUNTER — Other Ambulatory Visit (HOSPITAL_COMMUNITY)
Admission: RE | Admit: 2020-06-28 | Discharge: 2020-06-28 | Disposition: A | Discharge status: Home / Self Care | Payer: Medicaid Other | Source: Ambulatory Visit | Attending: Obstetrics and Gynecology | Admitting: Obstetrics and Gynecology

## 2020-06-28 ENCOUNTER — Other Ambulatory Visit: Payer: Medicaid Other

## 2020-06-28 ENCOUNTER — Encounter: Payer: Self-pay | Admitting: Obstetrics and Gynecology

## 2020-06-28 VITALS — BP 106/65 | HR 93 | Wt 118.6 lb

## 2020-06-28 DIAGNOSIS — Z3403 Encounter for supervision of normal first pregnancy, third trimester: Secondary | ICD-10-CM | POA: Diagnosis not present

## 2020-06-28 DIAGNOSIS — Z3A36 36 weeks gestation of pregnancy: Secondary | ICD-10-CM | POA: Insufficient documentation

## 2020-06-28 DIAGNOSIS — Z1389 Encounter for screening for other disorder: Secondary | ICD-10-CM

## 2020-06-28 DIAGNOSIS — Z331 Pregnant state, incidental: Secondary | ICD-10-CM

## 2020-06-28 LAB — POCT URINALYSIS DIPSTICK OB
Blood, UA: NEGATIVE
Glucose, UA: NEGATIVE
Nitrite, UA: NEGATIVE
POC,PROTEIN,UA: NEGATIVE

## 2020-06-28 NOTE — Progress Notes (Signed)
Patient ID: JONQUIL STUBBE, female   DOB: [birth year], 24 y.o.   MRN: [id number]   LOW-RISK PREGNANCY VISIT Patient name: Susan Lara MRN [id number]  Date of birth: 06-Mar-1996 Chief Complaint:   Routine Prenatal Visit (GBS, GC/CHL)  History of Present Illness:   Susan Lara is a 24 y.o. G1P0 female at [redacted]w[redacted]d with an Estimated Date of Delivery: 07/23/20 being seen today for ongoing management of a low-risk pregnancy.  Today she reports no complaints. She went to the hospital for a yeast infection and abdominal cramping on 06/16/2020. She went back on 06/22/2020 and was anemic. She reports that everyone is excited about the baby. This is her first pregnancy. She took a breastfeeding class yesterday. She is accompanied by her mother.  Contractions: Not present. Vag. Bleeding: None.  Movement: Present. denies leaking of fluid. Review of Systems:   Pertinent items are noted in HPI Denies abnormal vaginal discharge w/ itching/odor/irritation, headaches, visual changes, shortness of breath, chest pain, abdominal pain, severe nausea/vomiting, or problems with urination or bowel movements unless otherwise stated above. Pertinent History Reviewed:  Reviewed past medical,surgical, social, obstetrical and family history.  Reviewed problem list, medications and allergies. Physical Assessment:   Vitals:   06/28/20 1040  BP: 106/65  Pulse: 93  Weight: 118 lb 9.6 oz (53.8 kg)  Body mass index is 21.35 kg/m.        Physical Examination:   General appearance: Well appearing, and in no distress  Mental status: Alert, oriented to person, place, and time  Skin: Warm & dry  Cardiovascular: Normal heart rate noted  Respiratory: Normal respiratory effort, no distress  Abdomen: Soft, gravid, nontender  Pelvic: Cervical exam performed         Extremities: Edema: None  Fetal Status:     Movement: Present    Results for orders placed or performed in visit on 06/28/20 (from the past 24  hour(s))  POC Urinalysis Dipstick OB   Collection Time: 06/28/20 10:46 AM  Result Value Ref Range   Color, UA     Clarity, UA     Glucose, UA Negative Negative   Bilirubin, UA     Ketones, UA trace    Spec Grav, UA     Blood, UA neg    pH, UA     POC,PROTEIN,UA Negative Negative, Trace, Small (1+), Moderate (2+), Large (3+), 4+   Urobilinogen, UA     Nitrite, UA neg    Leukocytes, UA Moderate (2+) (A) Negative   Appearance     Odor      Assessment & Plan:  1) Low-risk pregnancy G1P0 at [redacted]w[redacted]d with an Estimated Date of Delivery: 07/23/20   2) Previous FGR, resolved now 19% with good AC 39%   Plan:  Continue routine obstetrical care. F/U in 1 week for EFW  Meds: No orders of the defined types were placed in this encounter.  Labs/procedures today: GBS, GC/CHL  Reviewed: Term labor symptoms and general obstetric precautions including but not limited to vaginal bleeding, contractions, leaking of fluid and fetal movement were reviewed in detail with the patient.  All questions were answered  Follow-up: 1 wk for BPP, dopplers.  By signing my name below, I, Pietro Cassis, attest that this documentation has been prepared under the direction and in the presence of Tilda Burrow, MD. Electronically Signed: Pietro Cassis, Medical Scribe. 06/28/20. 11:06 AM.

## 2020-06-30 LAB — STREP GP B NAA: Strep Gp B NAA: NEGATIVE

## 2020-07-02 ENCOUNTER — Other Ambulatory Visit: Payer: Medicaid Other

## 2020-07-02 ENCOUNTER — Encounter: Payer: Self-pay | Admitting: *Deleted

## 2020-07-02 LAB — CERVICOVAGINAL ANCILLARY ONLY
Chlamydia: NEGATIVE
Comment: NEGATIVE
Comment: NORMAL
Neisseria Gonorrhea: NEGATIVE

## 2020-07-04 ENCOUNTER — Other Ambulatory Visit: Payer: Self-pay | Admitting: Obstetrics and Gynecology

## 2020-07-04 DIAGNOSIS — Z87898 Personal history of other specified conditions: Secondary | ICD-10-CM

## 2020-07-05 ENCOUNTER — Ambulatory Visit (INDEPENDENT_AMBULATORY_CARE_PROVIDER_SITE_OTHER): Payer: Medicaid Other | Admitting: Obstetrics & Gynecology

## 2020-07-05 ENCOUNTER — Encounter: Payer: Self-pay | Admitting: Obstetrics & Gynecology

## 2020-07-05 ENCOUNTER — Ambulatory Visit (INDEPENDENT_AMBULATORY_CARE_PROVIDER_SITE_OTHER): Payer: Medicaid Other

## 2020-07-05 VITALS — BP 98/63 | HR 87 | Wt 120.0 lb

## 2020-07-05 DIAGNOSIS — Z87898 Personal history of other specified conditions: Secondary | ICD-10-CM

## 2020-07-05 DIAGNOSIS — O365930 Maternal care for other known or suspected poor fetal growth, third trimester, not applicable or unspecified: Secondary | ICD-10-CM

## 2020-07-05 DIAGNOSIS — Z3403 Encounter for supervision of normal first pregnancy, third trimester: Secondary | ICD-10-CM

## 2020-07-05 NOTE — Progress Notes (Signed)
   LOW-RISK PREGNANCY VISIT Patient name: Susan Lara MRN [id number]  Date of birth: 14-Oct-1996 Chief Complaint:   Routine Prenatal Visit  History of Present Illness:   Susan Lara is a 24 y.o. G1P0 female at [redacted]w[redacted]d with an Estimated Date of Delivery: 07/23/20 being seen today for ongoing management of a low-risk pregnancy.  Depression screen [facility] 2/9 04/17/2020 01/10/2020 11/22/2019  Decreased Interest 0 0 0  Down, Depressed, Hopeless 0 0 0  PHQ - 2 Score 0 0 0  Altered sleeping 0 0 -  Tired, decreased energy 0 0 -  Change in appetite 0 0 -  Feeling bad or failure about yourself  0 0 -  Trouble concentrating 0 0 -  Moving slowly or fidgety/restless 0 0 -  Suicidal thoughts 0 0 -  PHQ-9 Score 0 0 -  Difficult doing work/chores Not difficult at all - -    Today she reports no complaints. Contractions: Not present. Vag. Bleeding: None.  Movement: Present. denies leaking of fluid. Review of Systems:   Pertinent items are noted in HPI Denies abnormal vaginal discharge w/ itching/odor/irritation, headaches, visual changes, shortness of breath, chest pain, abdominal pain, severe nausea/vomiting, or problems with urination or bowel movements unless otherwise stated above. Pertinent History Reviewed:  Reviewed past medical,surgical, social, obstetrical and family history.  Reviewed problem list, medications and allergies. Physical Assessment:   Vitals:   07/05/20 0959  BP: (!) 98/63  Pulse: 87  Weight: 120 lb (54.4 kg)  Body mass index is 21.6 kg/m.        Physical Examination:   General appearance: Well appearing, and in no distress  Mental status: Alert, oriented to person, place, and time  Skin: Warm & dry  Cardiovascular: Normal heart rate noted  Respiratory: Normal respiratory effort, no distress  Abdomen: Soft, gravid, nontender  Pelvic: Cervical exam performed  Dilation: 3 Effacement (%): 50 Station: -2  Extremities: Edema: None  Fetal Status: Fetal Heart Rate  (bpm): 34 Fundal Height: 148 cm Movement: Present Presentation: Vertex  Chaperone: Amanda Rash    No results found for this or any previous visit (from the past 24 hour(s)).  Assessment & Plan:  1) Low-risk pregnancy G1P0 at [redacted]w[redacted]d with an Estimated Date of Delivery: 07/23/20   2) Borderline growth percentile with AC 23%, will recommend IOL at 39-40 weeks if undelivered, currently 3 cm   Meds: No orders of the defined types were placed in this encounter.  Labs/procedures today: sonogram  Plan:  Continue routine obstetrical care  Next visit: prefers in person    Reviewed: Term labor symptoms and general obstetric precautions including but not limited to vaginal bleeding, contractions, leaking of fluid and fetal movement were reviewed in detail with the patient.  All questions were answered. Has home bp cuff. Rx faxed to . Check bp weekly, let us know if >140/90.   Follow-up: Return in about 1 week (around 07/12/2020) for LROB, with Dr Despina Hidden.  No orders of the defined types were placed in this encounter.  Lazaro Arms CNM, [facility] 07/05/2020 10:38 AM

## 2020-07-05 NOTE — Progress Notes (Signed)
[place] 37+3 wks,cephalic,FHR 148 bpm,posterior placenta gr 3,afi 11 cm,EFW 2633 g 12%

## 2020-07-07 DIAGNOSIS — Z419 Encounter for procedure for purposes other than remedying health state, unspecified: Secondary | ICD-10-CM | POA: Diagnosis not present

## 2020-07-08 ENCOUNTER — Encounter (HOSPITAL_COMMUNITY): Payer: Self-pay | Admitting: Family Medicine

## 2020-07-08 ENCOUNTER — Other Ambulatory Visit: Payer: Self-pay

## 2020-07-08 ENCOUNTER — Inpatient Hospital Stay (HOSPITAL_COMMUNITY)
Admission: AD | Admit: 2020-07-08 | Discharge: 2020-07-08 | Disposition: A | Discharge status: Home / Self Care | Payer: Medicaid Other | Attending: Family Medicine | Admitting: Family Medicine

## 2020-07-08 DIAGNOSIS — Z3A37 37 weeks gestation of pregnancy: Secondary | ICD-10-CM | POA: Insufficient documentation

## 2020-07-08 DIAGNOSIS — O479 False labor, unspecified: Secondary | ICD-10-CM

## 2020-07-08 DIAGNOSIS — B3731 Acute candidiasis of vulva and vagina: Secondary | ICD-10-CM | POA: Diagnosis present

## 2020-07-08 DIAGNOSIS — O471 False labor at or after 37 completed weeks of gestation: Secondary | ICD-10-CM | POA: Diagnosis not present

## 2020-07-08 DIAGNOSIS — B373 Candidiasis of vulva and vagina: Secondary | ICD-10-CM

## 2020-07-08 DIAGNOSIS — O98813 Other maternal infectious and parasitic diseases complicating pregnancy, third trimester: Secondary | ICD-10-CM | POA: Diagnosis not present

## 2020-07-08 HISTORY — DX: Candidiasis of vulva and vagina: B37.3

## 2020-07-08 HISTORY — DX: Acute candidiasis of vulva and vagina: B37.31

## 2020-07-08 LAB — WET PREP, GENITAL
Clue Cells Wet Prep HPF POC: NONE SEEN
Sperm: NONE SEEN
Trich, Wet Prep: NONE SEEN
Yeast Wet Prep HPF POC: NONE SEEN

## 2020-07-08 LAB — POCT FERN TEST: POCT Fern Test: NEGATIVE

## 2020-07-08 MED ORDER — TERCONAZOLE 0.4 % VA CREA: 1.0000 | TOPICAL_CREAM | Freq: Every day | VAGINAL | 0 refills | Status: DC

## 2020-07-08 NOTE — MAU Note (Signed)
.   Susan Lara is a 24 y.o. at [redacted]w[redacted]d here in MAU reporting: LOF that started at 48 today. Denies any VB. +FM  Onset of complaint: toady 1226 Pain score: 10 Vitals:   07/08/20 1351  BP: (!) 96/52  Pulse: 84  Resp: 18  Temp: 98.6 F (37 C)     FHT:135 Lab orders placed from triage: UA

## 2020-07-08 NOTE — MAU Provider Note (Signed)
History     CSN: [id number]  Arrival date and time: 07/08/20 1252   Provider at bedside at 1615     Chief Complaint  Patient presents with  . Rupture of Membranes   Ms. Susan Lara is a 24 y.o. year old G1P0 female at [redacted]w[redacted]d weeks gestation who presents to MAU reporting contractions all day and questionable leaking fluid since 1226 today. She reports the contractions are uncomfortable, but she is able to tolerate them. She receives her [facility] at [facility] and was last seen Friday 07/05/20. She was dilated 4 cm.   OB History    Gravida  1   Para      Term      Preterm      AB      Living        SAB      TAB      Ectopic      Multiple      Live Births              Past Medical History:  Diagnosis Date  . Medical history non-contributory     Past Surgical History:  Procedure Laterality Date  . ESOPHAGEAL MANOMETRY N/A 03/07/2018   Procedure: ESOPHAGEAL MANOMETRY (EM);  Surgeon: Jeani Hawking, MD;  Location: [facility];  Service: Endoscopy;  Laterality: N/A;  . SINUS EXPLORATION  2011    Family History  Problem Relation Age of Onset  . Cancer Paternal Grandmother        breast  . Cancer Maternal Grandfather        brain    Social History   Tobacco Use  . Smoking status: Never Smoker  . Smokeless tobacco: Never Used  Vaping Use  . Vaping Use: Never used  Substance Use Topics  . Alcohol use: Not Currently  . Drug use: Never    Allergies: No Known Allergies  No medications prior to admission.    Review of Systems  Constitutional: Negative.   HENT: Negative.   Eyes: Negative.   Respiratory: Negative.   Cardiovascular: Negative.   Gastrointestinal: Negative.   Endocrine: Negative.   Genitourinary: Positive for pelvic pain (some pain with contractions) and vaginal discharge (leaked clear to yellow fluid down legs at 1226 this afternoon).  Musculoskeletal: Negative.   Skin: Negative.   Allergic/Immunologic: Negative.    Neurological: Negative.   Hematological: Negative.   Psychiatric/Behavioral: Negative.    Physical Exam   Blood pressure 97/62, pulse 84, temperature 98.6 F (37 C), resp. rate 18, last menstrual period 10/17/2019.  Physical Exam Vitals and nursing note reviewed. Exam conducted with a chaperone present.  Constitutional:      Appearance: Normal appearance. She is normal weight.  HENT:     Head: Normocephalic and atraumatic.  Cardiovascular:     Rate and Rhythm: Normal rate.  Pulmonary:     Effort: Pulmonary effort is normal.  Genitourinary:    Comments: Speculum exam: copious amounts of thick, chunky, yellowish-white discharge adherent to vaginal walls and cervix, large mucous plug coming out of cervical os, Neg pooling, fern slide obtained. Musculoskeletal:        General: Normal range of motion.     Cervical back: Normal range of motion.  Skin:    General: Skin is warm and dry.  Neurological:     Mental Status: She is alert and oriented to person, place, and time.  Psychiatric:        Mood and Affect: Mood normal.  Behavior: Behavior normal.        Thought Content: Thought content normal.        Judgment: Judgment normal.     MAU Course  Procedures  MDM Wet Prep Crist Fat Slide  Results for orders placed or performed during the hospital encounter of 07/08/20 (from the past 24 hour(s))  Wet prep, genital     Status: Abnormal   Collection Time: 07/08/20  4:22 PM   Specimen: Vaginal  Result Value Ref Range   Yeast Wet Prep HPF POC NONE SEEN NONE SEEN   Trich, Wet Prep NONE SEEN NONE SEEN   Clue Cells Wet Prep HPF POC NONE SEEN NONE SEEN   WBC, Wet Prep HPF POC MANY (A) NONE SEEN   Sperm NONE SEEN   Fern Test     Status: None   Collection Time: 07/08/20  6:04 PM  Result Value Ref Range   POCT Fern Test Negative = intact amniotic membranes     Assessment and Plan  Candida vaginitis - Rx for Terazol 4% cream vaginally hs x 7 days - Information provided on  vaginal yeast infection   False labor  - Information provided on signs of labor  - Discharge patient - Keep scheduled appt with [facility] on Friday 07/12/2020 - Patient verbalized an understanding of the plan of care and agrees.     Raelyn Mora, MSN, CNM 07/08/2020, 6:40 PM

## 2020-07-09 ENCOUNTER — Telehealth: Payer: Self-pay | Admitting: *Deleted

## 2020-07-09 NOTE — Telephone Encounter (Signed)
Patient called requesting to speak with Dr Despina Hidden.  I informed her that he was still currently at lunch and would not be back in the office until around 2pm.  Asked if there was something that I could help her with.  Patient stated "no, I want to talk with him about something we discussed last Friday". Informed I would be happy to send him the message but it could be later in the afternoon before he returned her call since he would be seeing patients when he returned.  Patient verbalized understanding.

## 2020-07-10 ENCOUNTER — Other Ambulatory Visit: Payer: Self-pay

## 2020-07-10 ENCOUNTER — Inpatient Hospital Stay (HOSPITAL_COMMUNITY): Payer: Medicaid Other | Admitting: Anesthesiology

## 2020-07-10 ENCOUNTER — Encounter (HOSPITAL_COMMUNITY): Payer: Self-pay | Admitting: Obstetrics & Gynecology

## 2020-07-10 ENCOUNTER — Inpatient Hospital Stay (HOSPITAL_COMMUNITY)
Admission: AD | Admit: 2020-07-10 | Discharge: 2020-07-13 | DRG: 807 | Disposition: A | Discharge status: Home / Self Care | Payer: Medicaid Other | Attending: Obstetrics and Gynecology | Admitting: Obstetrics and Gynecology

## 2020-07-10 DIAGNOSIS — Z20822 Contact with and (suspected) exposure to covid-19: Secondary | ICD-10-CM | POA: Diagnosis present

## 2020-07-10 DIAGNOSIS — O26893 Other specified pregnancy related conditions, third trimester: Secondary | ICD-10-CM | POA: Diagnosis not present

## 2020-07-10 DIAGNOSIS — Z3A38 38 weeks gestation of pregnancy: Secondary | ICD-10-CM | POA: Diagnosis not present

## 2020-07-10 DIAGNOSIS — O365930 Maternal care for other known or suspected poor fetal growth, third trimester, not applicable or unspecified: Principal | ICD-10-CM | POA: Diagnosis present

## 2020-07-10 DIAGNOSIS — Z3403 Encounter for supervision of normal first pregnancy, third trimester: Secondary | ICD-10-CM

## 2020-07-10 DIAGNOSIS — O4202 Full-term premature rupture of membranes, onset of labor within 24 hours of rupture: Secondary | ICD-10-CM | POA: Diagnosis not present

## 2020-07-10 LAB — CBC
HCT: 38 % (ref 36.0–46.0)
Hemoglobin: 12.6 g/dL (ref 12.0–15.0)
MCH: 28.6 pg (ref 26.0–34.0)
MCHC: 33.2 g/dL (ref 30.0–36.0)
MCV: 86.4 fL (ref 80.0–100.0)
Platelets: 244 10*3/uL (ref 150–400)
RBC: 4.4 MIL/uL (ref 3.87–5.11)
RDW: 13.8 % (ref 11.5–15.5)
WBC: 16.5 10*3/uL — ABNORMAL HIGH (ref 4.0–10.5)
nRBC: 0 % (ref 0.0–0.2)

## 2020-07-10 LAB — POCT FERN TEST: POCT Fern Test: POSITIVE

## 2020-07-10 LAB — TYPE AND SCREEN
ABO/RH(D): O POS
Antibody Screen: NEGATIVE

## 2020-07-10 LAB — ABO/RH: ABO/RH(D): O POS

## 2020-07-10 LAB — SARS CORONAVIRUS 2 BY RT PCR (HOSPITAL ORDER, PERFORMED IN ~~LOC~~ HOSPITAL LAB): SARS Coronavirus 2: NEGATIVE

## 2020-07-10 MED ORDER — OXYTOCIN BOLUS FROM INFUSION: 333.0000 mL | Freq: Once | INTRAVENOUS | Status: DC

## 2020-07-10 MED ORDER — ACETAMINOPHEN 325 MG PO TABS: 650.0000 mg | ORAL_TABLET | ORAL | Status: DC | PRN

## 2020-07-10 MED ORDER — LACTATED RINGERS IV SOLN
500.0000 mL | INTRAVENOUS | Status: DC | PRN
  Administered 2020-07-10 – 2020-07-11 (×2): 500 mL via INTRAVENOUS

## 2020-07-10 MED ORDER — FENTANYL-BUPIVACAINE-NACL 0.5-0.125-0.9 MG/250ML-% EP SOLN
12.0000 mL/h | EPIDURAL | Status: DC | PRN
  Filled 2020-07-10: qty 250

## 2020-07-10 MED ORDER — LIDOCAINE HCL (PF) 1 % IJ SOLN: 30.0000 mL | INTRAMUSCULAR | Status: DC | PRN

## 2020-07-10 MED ORDER — OXYCODONE-ACETAMINOPHEN 5-325 MG PO TABS: 2.0000 | ORAL_TABLET | ORAL | Status: DC | PRN

## 2020-07-10 MED ORDER — EPHEDRINE 5 MG/ML INJ
10.0000 mg | INTRAVENOUS | Status: AC | PRN
  Administered 2020-07-11 (×2): 10 mg via INTRAVENOUS

## 2020-07-10 MED ORDER — OXYTOCIN-SODIUM CHLORIDE 30-0.9 UT/500ML-% IV SOLN
1.0000 m[IU]/min | INTRAVENOUS | Status: DC
  Administered 2020-07-11: 2 m[IU]/min via INTRAVENOUS
  Filled 2020-07-10: qty 500

## 2020-07-10 MED ORDER — OXYTOCIN-SODIUM CHLORIDE 30-0.9 UT/500ML-% IV SOLN: 2.5000 [IU]/h | INTRAVENOUS | Status: DC

## 2020-07-10 MED ORDER — LACTATED RINGERS IV SOLN: INTRAVENOUS | Status: DC

## 2020-07-10 MED ORDER — SODIUM CHLORIDE (PF) 0.9 % IJ SOLN
INTRAMUSCULAR | Status: DC | PRN
  Administered 2020-07-10: 12 mL/h via EPIDURAL

## 2020-07-10 MED ORDER — TERBUTALINE SULFATE 1 MG/ML IJ SOLN: 0.2500 mg | Freq: Once | INTRAMUSCULAR | Status: DC | PRN

## 2020-07-10 MED ORDER — PHENYLEPHRINE 40 MCG/ML (10ML) SYRINGE FOR IV PUSH (FOR BLOOD PRESSURE SUPPORT)
80.0000 ug | PREFILLED_SYRINGE | INTRAVENOUS | Status: AC | PRN
  Administered 2020-07-11 (×3): 80 ug via INTRAVENOUS
  Filled 2020-07-10 (×2): qty 10

## 2020-07-10 MED ORDER — SOD CITRATE-CITRIC ACID 500-334 MG/5ML PO SOLN: 30.0000 mL | ORAL | Status: DC | PRN

## 2020-07-10 MED ORDER — PHENYLEPHRINE 40 MCG/ML (10ML) SYRINGE FOR IV PUSH (FOR BLOOD PRESSURE SUPPORT): 80.0000 ug | PREFILLED_SYRINGE | INTRAVENOUS | Status: DC | PRN

## 2020-07-10 MED ORDER — LIDOCAINE HCL (PF) 1 % IJ SOLN
INTRAMUSCULAR | Status: DC | PRN
  Administered 2020-07-10: 5 mL via EPIDURAL

## 2020-07-10 MED ORDER — LACTATED RINGERS IV SOLN
500.0000 mL | Freq: Once | INTRAVENOUS | Status: AC
  Administered 2020-07-10: 500 mL via INTRAVENOUS

## 2020-07-10 MED ORDER — ONDANSETRON HCL 4 MG/2ML IJ SOLN: 4.0000 mg | Freq: Four times a day (QID) | INTRAMUSCULAR | Status: DC | PRN

## 2020-07-10 MED ORDER — DIPHENHYDRAMINE HCL 50 MG/ML IJ SOLN: 12.5000 mg | INTRAMUSCULAR | Status: DC | PRN

## 2020-07-10 MED ORDER — EPHEDRINE 5 MG/ML INJ
10.0000 mg | INTRAVENOUS | Status: DC | PRN
  Filled 2020-07-10: qty 10

## 2020-07-10 MED ORDER — OXYCODONE-ACETAMINOPHEN 5-325 MG PO TABS: 1.0000 | ORAL_TABLET | ORAL | Status: DC | PRN

## 2020-07-10 NOTE — Progress Notes (Signed)
LABOR PROGRESS NOTE  Susan BRAGGS is a 24 y.o. G1P0 at [redacted]w[redacted]d  admitted for SROM   Subjective: Patient comfortable with epidural at this time  Objective: BP 100/65   Pulse 82   Temp 97.6 F (36.4 C) (Oral)   Resp 16   Ht 5' 2.5" (1.588 m)   Wt 54.2 kg   LMP 10/17/2019 (Exact Date)   SpO2 99%   BMI 21.49 kg/m  or  Vitals:   07/10/20 2205 07/10/20 2210 07/10/20 2215 07/10/20 2230  BP: 96/64 97/65 97/65  100/65  Pulse: 86 83 74 82  Resp:  16  16  Temp:      TempSrc:      SpO2: 99% 99% 99%   Weight:      Height:        Dilation: 3 Effacement (%): 70 Cervical Position: Middle Station: -1 Presentation: Vertex Exam by:: [email or web address], RN FHT: baseline rate 130, moderate varibility, +accel, no decel Toco: 3-5 minutes   Labs: Lab Results  Component Value Date   WBC 16.5 (H) 07/10/2020   HGB 12.6 07/10/2020   HCT 38.0 07/10/2020   MCV 86.4 07/10/2020   PLT 244 07/10/2020    Patient Active Problem List   Diagnosis Date Noted  . Normal labor 07/10/2020  . Candida vaginitis 07/08/2020  . False labor 07/08/2020  . IUGR (intrauterine growth restriction)-RESOLVED 05/17/2020  . Supervision of normal pregnancy 01/10/2020    Assessment / Plan: 24 y.o. G1P0 at [redacted]w[redacted]d here for SROM @2000  on 07/10/20  Labor: No cervical change since ROM. Educated and discussed augmentation at this time. Patient request to wait until closer to 0030 to assess for SOL. Patient and provider agree with plan of care. RN will check pain around 0030, if no cervical change at that time will initiate pitocin augmentation.  Fetal Wellbeing:  Cat I  Pain Control:  Epidural Anticipated MOD:  SVD  , CNM 07/10/2020, 11:55 PM

## 2020-07-10 NOTE — Anesthesia Preprocedure Evaluation (Signed)
Anesthesia Evaluation  Patient identified by MRN, date of birth, ID band Patient awake    Reviewed: Allergy & Precautions, NPO status , Patient's Chart, lab work & pertinent test results  Airway Mallampati: II  TM Distance: >3 FB Neck ROM: Full    Dental no notable dental hx. (+) Teeth Intact, Dental Advisory Given   Pulmonary neg pulmonary ROS,  Covid PND   Pulmonary exam normal breath sounds clear to auscultation       Cardiovascular negative cardio ROS Normal cardiovascular exam Rhythm:Regular Rate:Normal     Neuro/Psych negative neurological ROS     GI/Hepatic negative GI ROS, Neg liver ROS,   Endo/Other  negative endocrine ROS  Renal/GU negative Renal ROS     Musculoskeletal negative musculoskeletal ROS (+)   Abdominal   Peds  Hematology Lab Results      Component                Value               Date                      WBC                      16.5 (H)            07/10/2020                HGB                      12.6                07/10/2020                HCT                      38.0                07/10/2020                MCV                      86.4                07/10/2020                PLT                      244                 07/10/2020              Anesthesia Other Findings   Reproductive/Obstetrics (+) Pregnancy                             Anesthesia Physical Anesthesia Plan  ASA: II  Anesthesia Plan:    Post-op Pain Management:    Induction:   PONV Risk Score and Plan:   Airway Management Planned:   Additional Equipment:   Intra-op Plan:   Post-operative Plan:   Informed Consent: I have reviewed the patients History and Physical, chart, labs and discussed the procedure including the risks, benefits and alternatives for the proposed anesthesia with the patient or authorized representative who has indicated his/her understanding and acceptance.        Plan Discussed with:   Anesthesia  Plan Comments: (38.1 Wk G1P0 for LEA)        Anesthesia Quick Evaluation

## 2020-07-10 NOTE — H&P (Addendum)
OBSTETRIC ADMISSION HISTORY AND PHYSICAL  Susan Lara is a 24 y.o. female G1P0 with IUP at [redacted]w[redacted]d by y7 wk [place] presenting for SROM. She reports +FMs, No LOF, no VB, no blurry vision, headaches or peripheral edema, and RUQ pain.  She plans on breastfeeding. She is unsure of birth control at this time She received her prenatal care at [facility]   Dating: By LMP  Estimated Date of Delivery: 07/23/20  Sono:   @37wk3d , CWD, normal anatomy, cephalic presentation,  2633 g, 12% EFW   Prenatal History/Complications:  -SGA  Past Medical History: Past Medical History:  Diagnosis Date  . Medical history non-contributory     Past Surgical History: Past Surgical History:  Procedure Laterality Date  . ESOPHAGEAL MANOMETRY N/A 03/07/2018   Procedure: ESOPHAGEAL MANOMETRY (EM);  Surgeon: 05/07/2018, MD;  Location: [facility];  Service: Endoscopy;  Laterality: N/A;  . SINUS EXPLORATION  2011    Obstetrical History: OB History    Gravida  1   Para      Term      Preterm      AB      Living        SAB      TAB      Ectopic      Multiple      Live Births              Social History: Social History   Socioeconomic History  . Marital status: Single    Spouse name: Not on file  . Number of children: Not on file  . Years of education: Not on file  . Highest education level: Some college, no degree  Occupational History  . Not on file  Tobacco Use  . Smoking status: Never Smoker  . Smokeless tobacco: Never Used  Vaping Use  . Vaping Use: Never used  Substance and Sexual Activity  . Alcohol use: Not Currently  . Drug use: Never  . Sexual activity: Yes    Birth control/protection: None  Other Topics Concern  . Not on file  Social History Narrative  . Not on file   Social Determinants of Health   Financial Resource Strain: Low Risk   . Difficulty of Paying Living Expenses: Not hard at all  Food Insecurity: No Food Insecurity  . Worried About  2012 in the Last Year: Never true  . Ran Out of Food in the Last Year: Never true  Transportation Needs: No Transportation Needs  . Lack of Transportation (Medical): No  . Lack of Transportation (Non-Medical): No  Physical Activity: Insufficiently Active  . Days of Exercise per Week: 4 days  . Minutes of Exercise per Session: 30 min  Stress: No Stress Concern Present  . Feeling of Stress : Not at all  Social Connections: Unknown  . Frequency of Communication with Friends and Family: More than three times a week  . Frequency of Social Gatherings with Friends and Family: More than three times a week  . Attends Religious Services: More than 4 times per year  . Active Member of Clubs or Organizations: No  . Attends Programme researcher, broadcasting/film/video Meetings: Never  . Marital Status: Patient refused    Family History: Family History  Problem Relation Age of Onset  . Cancer Paternal Grandmother        breast  . Cancer Maternal Grandfather        brain    Allergies: No Known Allergies  Medications Prior to Admission  Medication Sig Dispense Refill Last Dose  . Prenatal Vit-Iron Carbonyl-FA (PRENATAL PLUS IRON) 29-1 MG TABS Take 1 tablet by mouth daily. 30 tablet 12 07/09/2020 at Unknown time  . Blood Pressure Monitor MISC For regular home bp monitoring during pregnancy 1 each 0   . docusate sodium (COLACE) 250 MG capsule Take 1 capsule (250 mg total) by mouth daily. 30 capsule 0  at not taking  . Doxylamine-Pyridoxine 10-10 MG TBEC Take 2 tablets by mouth at bedtime. Two tabs at bedtime, Increase by one tablet in the morning, and one at lunch, as needed. 60 tablet 3  at not taking  . ferrous sulfate 325 (65 FE) MG tablet Take 1 tablet (325 mg total) by mouth daily. 30 tablet 0  at not taking  . terconazole (TERAZOL 7) 0.4 % vaginal cream Place 1 applicator vaginally at bedtime for 7 days. 45 g 0  at not taking     Review of Systems   All systems reviewed and negative except as  stated in HPI  Blood pressure 118/62, pulse 94, temperature 97.7 F (36.5 C), temperature source Oral, resp. rate 17, height 5' 2.5" (1.588 m), weight 54.2 kg, last menstrual period 10/17/2019, SpO2 100 %. General appearance: alert, cooperative, appears stated age and no distress Lungs: clear to auscultation bilaterally Heart: regular rate and rhythm Abdomen: soft, non-tender; bowel sounds normal Extremities: Homans sign is negative, no sign of DVT Presentation: cephalic Fetal monitoringBaseline: 125 bpm, Accelerations: Reactive and Decelerations: Absent Uterine activityFrequency: Every 4-6 minutes and Duration: 45 seconds Dilation: 3 Effacement (%): 70 Station: -1 Exam by:: Erle Crocker, RN   Prenatal labs: ABO, Rh: --/--/PENDING (08/04 2024) Antibody: PENDING (08/04 2024) Rubella: 16.80 (02/03 1056) RPR: Non Reactive (05/12 0903)  HBsAg: Negative (02/03 1056)  HIV: Non Reactive (05/12 0903)  GBS: Negative/-- (07/23 1400)  2 hr Glucola: 61, 97, 57 Genetic screening  Normal  Anatomy US normal    FAMILY TREE  LAB RESULTS  Language English Pap Declines until pp  Initiated care at 12wk GC/CT Initial: -/-           36wks: neg neg  Dating by LMP c/w 7wk U/S    Support person  Genetics NT/IT: neg    AFP:      MaterniT21:nl female    /HgbE neg  Flu vaccine Declined 02/21/20  CF neg  TDaP vaccine  SMA neg  Rhogam n/a Fragile X neg       Anatomy US Normal female '[name]' Blood Type O/Positive/-- (02/03 1056)  Feeding Plan breast Antibody Negative (02/03 1056)  Contraception DMPA  HBsAg Negative (02/03 1056)  Circumcision n/a RPR Non Reactive (02/03 1056)  Pediatrician List given Rubella  16.80 (02/03 1056)  Prenatal Classes Online recommended HIV Non Reactive (02/03 1056)    GTT/A1C Early:      26-28wks: normal  BTL Consent n/a GBS     neg   [ ]  PCN allergy  VBAC Consent n/a    Waterbirth [ ] Class [ ] Consent [ ] CNM visit PP Needs         Prenatal Transfer Tool   Maternal Diabetes: No Genetic Screening: Normal Maternal Ultrasounds/Referrals: Normal Fetal Ultrasounds or other Referrals:  None Maternal Substance Abuse:  No Significant Maternal Medications:  None Significant Maternal Lab Results: Group B Strep negative  Results for orders placed or performed during the hospital encounter of 07/10/20 (from the past 24 hour(s))  Time: 07/10/20  8:11 PM  Result Value Ref Range   POCT Fern Test Positive = ruptured amniotic membanes   Type and screen [facility]   Collection Time: 07/10/20  8:24 PM  Result Value Ref Range   ABO/RH(D) PENDING    Antibody Screen PENDING    Sample Expiration      07/13/2020,2359 Performed at [facility], [address], [place], [place] [address]   CBC   Collection Time: 07/10/20  8:40 PM  Result Value Ref Range   WBC 16.5 (H) 4.0 - 10.5 K/uL   RBC 4.40 3.87 - 5.11 MIL/uL   Hemoglobin 12.6 12.0 - 15.0 g/dL   HCT 23.5 36 - 46 %   MCV 86.4 80.0 - 100.0 fL   MCH 28.6 26.0 - 34.0 pg   MCHC 33.2 30.0 - 36.0 g/dL   RDW 36.1 44.3 - 15.4 %   Platelets 244 150 - 400 K/uL   nRBC 0.0 0.0 - 0.2 %    Patient Active Problem List   Diagnosis Date Noted  . Normal labor 07/10/2020  . Candida vaginitis 07/08/2020  . False labor 07/08/2020  . IUGR (intrauterine growth restriction)-RESOLVED 05/17/2020  . Supervision of normal pregnancy 01/10/2020    Assessment/Plan:  TEIGAN MANNER is a 24 y.o. G1P0 at [redacted]w[redacted]d here for SROM on 8.4.21 at 2000, clear fluid, good fetal movement,   #Labor:will recheck in 3 hours to assess SOL, if not, will discuss augmentation with patient  #Pain: Epidural  #FWB: Cat 1 tracing  #ID:  GBS neg  #MOF: breast  #MOC:unsure   Bryson Dames, Student-MidWife  07/10/2020, 9:21 PM  Attestation of Supervision of Student:  I confirm that I have verified the information documented in the nurse midwife student's note and that I have also  personally reperformed the history, physical exam and all medical decision making activities.  I have verified that all services and findings are accurately documented in this student's note; and I agree with management and plan as outlined in the documentation. I have also made any necessary editorial changes.  Sharyon Cable, CNM [facility] [employer] 07/10/2020 11:28 PM

## 2020-07-10 NOTE — Anesthesia Procedure Notes (Signed)
Epidural Patient location during procedure: OB Start time: 07/10/2020 9:35 PM End time: 07/10/2020 9:48 PM  Staffing Anesthesiologist: Trevor Iha, MD Performed: anesthesiologist   Preanesthetic Checklist Completed: patient identified, IV checked, site marked, risks and benefits discussed, surgical consent, monitors and equipment checked, pre-op evaluation and timeout performed  Epidural Patient position: sitting Prep: DuraPrep and site prepped and draped Patient monitoring: continuous pulse ox and blood pressure Approach: midline Location: L3-L4 Injection technique: LOR air  Needle:  Needle type: Tuohy  Needle gauge: 17 G Needle length: 9 cm and 9 Needle insertion depth: 5 cm cm Catheter type: closed end flexible Catheter size: 19 Gauge Catheter at skin depth: 11 cm Test dose: negative  Assessment Events: blood not aspirated, injection not painful, no injection resistance, no paresthesia and negative IV test  Additional Notes Patient identified. Risks/Benefits/Options discussed with patient including but not limited to bleeding, infection, nerve damage, paralysis, failed block, incomplete pain control, headache, blood pressure changes, nausea, vomiting, reactions to medication both or allergic, itching and postpartum back pain. Confirmed with bedside nurse the patient's most recent platelet count. Confirmed with patient that they are not currently taking any anticoagulation, have any bleeding history or any family history of bleeding disorders. Patient expressed understanding and wished to proceed. All questions were answered. Sterile technique was used throughout the entire procedure. Please see nursing notes for vital signs. Test dose was given through epidural needle and negative prior to continuing to dose epidural or start infusion. Warning signs of high block given to the patient including shortness of breath, tingling/numbness in hands, complete motor block, or any  concerning symptoms with instructions to call for help. Patient was given instructions on fall risk and not to get out of bed. All questions and concerns addressed with instructions to call with any issues. 1 Attempt (S) . Patient tolerated procedure well.

## 2020-07-10 NOTE — MAU Note (Addendum)
Pt began contracting 2-3 mins apart around 5 p.m.  Denies LOF or VB. + FM. Was 3  cm yesterday.

## 2020-07-10 NOTE — MAU Note (Signed)
covid swab obtained without difficulty and pt tol well. No symptoms °

## 2020-07-11 ENCOUNTER — Encounter (HOSPITAL_COMMUNITY): Payer: Self-pay | Admitting: Obstetrics & Gynecology

## 2020-07-11 DIAGNOSIS — Z3A38 38 weeks gestation of pregnancy: Secondary | ICD-10-CM | POA: Diagnosis not present

## 2020-07-11 DIAGNOSIS — O4202 Full-term premature rupture of membranes, onset of labor within 24 hours of rupture: Secondary | ICD-10-CM | POA: Diagnosis not present

## 2020-07-11 LAB — RPR: RPR Ser Ql: NONREACTIVE

## 2020-07-11 MED ORDER — WITCH HAZEL-GLYCERIN EX PADS: 1.0000 "application " | MEDICATED_PAD | CUTANEOUS | Status: DC | PRN

## 2020-07-11 MED ORDER — ZOLPIDEM TARTRATE 5 MG PO TABS: 5.0000 mg | ORAL_TABLET | Freq: Every evening | ORAL | Status: DC | PRN

## 2020-07-11 MED ORDER — SENNOSIDES-DOCUSATE SODIUM 8.6-50 MG PO TABS
2.0000 | ORAL_TABLET | ORAL | Status: DC
  Administered 2020-07-11 – 2020-07-13 (×2): 2 via ORAL
  Filled 2020-07-11 (×2): qty 2

## 2020-07-11 MED ORDER — PRENATAL MULTIVITAMIN CH
1.0000 | ORAL_TABLET | Freq: Every day | ORAL | Status: DC
  Administered 2020-07-12 – 2020-07-13 (×2): 1 via ORAL
  Filled 2020-07-11 (×2): qty 1

## 2020-07-11 MED ORDER — ONDANSETRON HCL 4 MG PO TABS: 4.0000 mg | ORAL_TABLET | ORAL | Status: DC | PRN

## 2020-07-11 MED ORDER — ONDANSETRON HCL 4 MG/2ML IJ SOLN: 4.0000 mg | INTRAMUSCULAR | Status: DC | PRN

## 2020-07-11 MED ORDER — IBUPROFEN 600 MG PO TABS
600.0000 mg | ORAL_TABLET | Freq: Four times a day (QID) | ORAL | Status: DC
  Administered 2020-07-11 – 2020-07-12 (×5): 600 mg via ORAL
  Filled 2020-07-11 (×6): qty 1

## 2020-07-11 MED ORDER — SIMETHICONE 80 MG PO CHEW: 80.0000 mg | CHEWABLE_TABLET | ORAL | Status: DC | PRN

## 2020-07-11 MED ORDER — TETANUS-DIPHTH-ACELL PERTUSSIS 5-2.5-18.5 LF-MCG/0.5 IM SUSP: 0.5000 mL | Freq: Once | INTRAMUSCULAR | Status: DC

## 2020-07-11 MED ORDER — ACETAMINOPHEN 325 MG PO TABS: 650.0000 mg | ORAL_TABLET | ORAL | Status: DC | PRN

## 2020-07-11 MED ORDER — DIBUCAINE (PERIANAL) 1 % EX OINT: 1.0000 "application " | TOPICAL_OINTMENT | CUTANEOUS | Status: DC | PRN

## 2020-07-11 MED ORDER — BENZOCAINE-MENTHOL 20-0.5 % EX AERO
1.0000 "application " | INHALATION_SPRAY | CUTANEOUS | Status: DC | PRN
  Administered 2020-07-11 – 2020-07-13 (×2): 1 via TOPICAL
  Filled 2020-07-11 (×2): qty 56

## 2020-07-11 MED ORDER — COCONUT OIL OIL: 1.0000 "application " | TOPICAL_OIL | Status: DC | PRN

## 2020-07-11 MED ORDER — DIPHENHYDRAMINE HCL 25 MG PO CAPS: 25.0000 mg | ORAL_CAPSULE | Freq: Four times a day (QID) | ORAL | Status: DC | PRN

## 2020-07-11 NOTE — Discharge Summary (Signed)
Postpartum Discharge Summary  Date of Service updated yes     Patient Name: Susan Lara DOB: 24-May-1996 MRN: [id number]  Date of admission: 07/10/2020 Delivery date:07/11/2020  Delivering provider: Gaylan Gerold R  Date of discharge: 07/13/2020  Admitting diagnosis: Normal labor [O80, Z37.9] Intrauterine pregnancy: [redacted]w[redacted]d    Secondary diagnosis:  Active Problems:   Normal labor  Additional problems: None    Discharge diagnosis: Term Pregnancy Delivered                                              Post partum procedures:None Augmentation: Pitocin Complications: None  Hospital course: Onset of Labor With Vaginal Delivery      24y.o. yo G1P1001 at 348w2das admitted in Latent Labor on 07/10/2020. Patient had an uncomplicated labor course as follows:  Membrane Rupture Time/Date: 8:00 PM ,07/10/2020   Delivery Method:Vaginal, Spontaneous  Episiotomy: None  Lacerations:  1st degree  Patient had an uncomplicated postpartum course.  She is ambulating, tolerating a regular diet, passing flatus, and urinating well. Patient is discharged home in stable condition on 07/13/20.  Newborn Data: Birth date:07/11/2020  Birth time:1:00 PM  Gender:Female  Living status:Living  Apgars:9 ,9  Weight:2800 g   Magnesium Sulfate received: No BMZ received: No Rhophylac:No MMR:N/A T-DaP:Declined Flu: N/A Transfusion:No  Physical exam  Vitals:   07/12/20 0524 07/12/20 1430 07/12/20 2122 07/13/20 0526  BP: (!) 98/47 (!) 85/56 94/62 (!) 97/57  Pulse: 66 78 70 61  Resp: 16 17 16 18   Temp: (!) 97.4 F (36.3 C) 98.4 F (36.9 C) 97.9 F (36.6 C) 98.5 F (36.9 C)  TempSrc: Oral Oral Oral Oral  SpO2: 100%   98%  Weight:      Height:       General: alert, cooperative and no distress Lochia: appropriate Uterine Fundus: firm Incision: N/A DVT Evaluation: No evidence of DVT seen on physical exam. Labs: Lab Results  Component Value Date   WBC 21.4 (H) 07/12/2020   HGB 10.1 (L)  07/12/2020   HCT 30.5 (L) 07/12/2020   MCV 86.6 07/12/2020   PLT 224 07/12/2020   CMP Latest Ref Rng & Units 06/22/2020  Glucose 70 - 99 mg/dL 96  BUN 6 - 20 mg/dL 7  Creatinine 0.44 - 1.00 mg/dL 0.68  Sodium 135 - 145 mmol/L 135  Potassium 3.5 - 5.1 mmol/L 4.0  Chloride 98 - 111 mmol/L 105  CO2 22 - 32 mmol/L 21(L)  Calcium 8.9 - 10.3 mg/dL 8.5(L)  Total Protein 6.5 - 8.1 g/dL 5.7(L)  Total Bilirubin 0.3 - 1.2 mg/dL 0.9  Alkaline Phos 38 - 126 U/L 139(H)  AST 15 - 41 U/L 23  ALT 0 - 44 U/L 11   Edinburgh Score: Edinburgh Postnatal Depression Scale Screening Tool 07/11/2020  I have been able to laugh and see the funny side of things. 0  I have looked forward with enjoyment to things. 0  I have blamed myself unnecessarily when things went wrong. 0  I have been anxious or worried for no good reason. 0  I have felt scared or panicky for no good reason. 0  Things have been getting on top of me. 0  I have been so unhappy that I have had difficulty sleeping. 0  I have felt sad or miserable. 0  I have been so unhappy that I have  been crying. 0  The thought of harming myself has occurred to me. 0  Edinburgh Postnatal Depression Scale Total 0     After visit meds:  Allergies as of 07/13/2020   No Known Allergies     Medication List    STOP taking these medications   Blood Pressure Monitor Misc   Doxylamine-Pyridoxine 10-10 MG Tbec   terconazole 0.4 % vaginal cream Commonly known as: Terazol 7     TAKE these medications   docusate sodium 250 MG capsule Commonly known as: COLACE Take 1 capsule (250 mg total) by mouth daily.   ferrous sulfate 325 (65 FE) MG tablet Take 1 tablet (325 mg total) by mouth daily.   ibuprofen 100 MG/5ML suspension Commonly known as: ADVIL Take 30 mLs (600 mg total) by mouth every 6 (six) hours as needed for moderate pain.   Prenatal Plus Iron 29-1 MG Tabs Take 1 tablet by mouth daily.        Discharge home in stable condition Infant  Feeding: both Infant Disposition:home with mother Discharge instruction: per After Visit Summary and Postpartum booklet. Activity: Advance as tolerated. Pelvic rest for 6 weeks.  Diet: routine diet Future Appointments: Future Appointments  Date Time Provider [place]  08/15/2020 11:30 AM Cresenzo-Dishmon, Joaquim Lai, CNM [facility]   Follow up Visit: Please schedule this patient for a In person postpartum visit in 4 weeks with the following provider: Any provider. Additional Postpartum F/U:None  Low risk pregnancy complicated by: None Delivery mode:  Vaginal, Spontaneous  Anticipated Birth Control:  Leota Sauers, NP

## 2020-07-11 NOTE — Discharge Instructions (Signed)

## 2020-07-11 NOTE — Progress Notes (Signed)
Labor Progress Note Susan Lara is a 24 y.o. G1P0 at [redacted]w[redacted]d presented for SROM.  S: Sleeping comfortably in this morning.  No new complaints at this time.    O:  BP (!) 96/52   Pulse 97   Temp 98 F (36.7 C) (Oral)   Resp 16   Ht 5' 2.5" (1.588 m)   Wt 54.2 kg   LMP 10/17/2019 (Exact Date)   SpO2 99%   BMI 21.49 kg/m  EFM: 140/moderate var/pos accels, rare mild variables  CVE: Dilation: 4 Effacement (%): 90 Cervical Position: Middle Station: -2 Presentation: Vertex Exam by:: veronica rogers, cnm   A&P: 24 y.o. G1P0 [redacted]w[redacted]d presenting for SROM.   #Labor: Progressing well. On pit, started at 0030, currently at 41. Will continue pit. #Pain: well controlled with epidural #FWB: Cat II but overall very reassuring. Good variability and return to baseline. #GBS negative   Mirian Mo, MD 9:49 AM

## 2020-07-11 NOTE — Progress Notes (Signed)
LABOR PROGRESS NOTE  Susan Lara is a 24 y.o. G1P0 at [redacted]w[redacted]d  admitted for SROM @2000  on 07/10/20  Subjective: Patient doing well, comfortable with epidural, not feeling any pressure in bottom   Objective: BP (!) 85/46   Pulse 84   Temp (!) 97.5 F (36.4 C) (Axillary)   Resp 16   Ht 5' 2.5" (1.588 m)   Wt 54.2 kg   LMP 10/17/2019 (Exact Date)   SpO2 99%   BMI 21.49 kg/m  or  Vitals:   07/11/20 0500 07/11/20 0530 07/11/20 0600 07/11/20 0630  BP: 92/61 (!) 93/53 (!) 94/56 (!) 85/46  Pulse: 97 74 92 84  Resp: 16     Temp: (!) 97.5 F (36.4 C)     TempSrc: Axillary     SpO2:      Weight:      Height:        IUPC placed  Dilation: 4 Effacement (%): 90 Cervical Position: Middle Station: -2 Presentation: Vertex Exam by:: Brier Reid, cnm FHT: baseline rate 140, moderate varibility, +accel, no decel Toco: 2-3  Labs: Lab Results  Component Value Date   WBC 16.5 (H) 07/10/2020   HGB 12.6 07/10/2020   HCT 38.0 07/10/2020   MCV 86.4 07/10/2020   PLT 244 07/10/2020    Patient Active Problem List   Diagnosis Date Noted  . Normal labor 07/10/2020  . Candida vaginitis 07/08/2020  . False labor 07/08/2020  . IUGR (intrauterine growth restriction)-RESOLVED 05/17/2020  . Supervision of normal pregnancy 01/10/2020    Assessment / Plan: 24 y.o. G1P0 at [redacted]w[redacted]d here for SROM   Labor: No cervical change since admission, forebag noted, AROM of forebag and IUPC placed, continue to titrate pitocin for active labor and MVUs  Fetal Wellbeing:  Cat I  Pain Control:  Epidural  Anticipated MOD:  SVD  [redacted]w[redacted]d, CNM 07/11/2020, 6:58 AM

## 2020-07-12 ENCOUNTER — Encounter: Payer: Medicaid Other | Admitting: Obstetrics & Gynecology

## 2020-07-12 LAB — CBC
HCT: 30.5 % — ABNORMAL LOW (ref 36.0–46.0)
Hemoglobin: 10.1 g/dL — ABNORMAL LOW (ref 12.0–15.0)
MCH: 28.7 pg (ref 26.0–34.0)
MCHC: 33.1 g/dL (ref 30.0–36.0)
MCV: 86.6 fL (ref 80.0–100.0)
Platelets: 224 10*3/uL (ref 150–400)
RBC: 3.52 MIL/uL — ABNORMAL LOW (ref 3.87–5.11)
RDW: 14 % (ref 11.5–15.5)
WBC: 21.4 10*3/uL — ABNORMAL HIGH (ref 4.0–10.5)
nRBC: 0 % (ref 0.0–0.2)

## 2020-07-12 NOTE — Lactation Note (Addendum)
This note was copied from a baby's chart. Lactation Consultation Note  Patient Name: Susan Lara Date: 07/12/2020 Reason for consult: Initial assessment;Infant < 6lbs   P1, Baby 18 hours old and sleeping.  < 6 lbs.  Mother would like to breastfeed, pump and supplement with formula. Mother eating breakfast.  Will call for LC to set up DEBP and observe latch. Faxed WIC pump referral to [facility]. Mom made aware of O/P services, breastfeeding support groups, community resources, and our phone # for post-discharge questions.   Returned to room to observe mother breastfeeding in side lying position with intermittent swallows. Sarah RN assisted mother with latch.  Discussed option of pumping with mother since she is formula feeding to help establish her milk supply. and she declined. Maternal grandmother suggest to mother to consider pumping.  Mother agreeable. Set up DEBP and mother called LC back to room.  Mother states she would like to now only formula feed. She felt baby was hungry after breastfeeding.  Provided education.   Recommend when [facility] calls to decline pump.      Maternal Data    Feeding    LATCH Score                   Interventions Interventions: Breast feeding basics reviewed;DEBP  Lactation Tools Discussed/Used     Consult Status Consult Status: Follow-up Date: 07/12/20 Follow-up type: In-patient    Dahlia Byes [facility] 07/12/2020, 9:42 AM

## 2020-07-12 NOTE — Progress Notes (Addendum)
Patient ID: Susan Lara, female   DOB: 05/01/1996, 24 y.o.   MRN: [id number]  Post Partum Day 1 s/p VD Subjective: Susan Lara is a pleasant 24 yo G1P1 female who was sitting up comfortably in the bedside chair this morning. She denies any fevers, chills, headaches, blurry vision, changes in vision, light-headedness, increased vaginal bleeding, or RUQ pain. Has been ambulating throughout the room. This is her first child, and attempting to breast feed and will be working with lactation today to improve feeds. Patient unsure about method of birth control at the moment. Discussed with patient the methods of birth control provided in the hospital, and that she would also be able to decide at her initial follow up outpatient visit.  Objective: Blood pressure (!) 98/47, pulse 66, temperature (!) 97.4 F (36.3 C), temperature source Oral, resp. rate 16, height 5' 2.5" (1.588 m), weight 54.2 kg, last menstrual period 10/17/2019, SpO2 100 %, unknown if currently breastfeeding.  Physical Exam:  General: Alert, NAD. Well-appearing female sitting upright in bedside chair. Lochia: appropriate and decreasing. Uterine Fundus: firm Incision: none DVT Evaluation: No evidence of DVT seen on physical exam.  Recent Labs    07/10/20 2040 07/12/20 0540  HGB 12.6 10.1*  HCT 38.0 30.5*    Assessment/Plan: Susan Lara is a 24 y.o. G1P1001 s/p VD at [redacted]w[redacted]d  # Routine postpartum care: Doing well, continue postpartum care. Contraception: Unsure, discussed available options provided in hospital and opportunity to decide on method on initial outpatient follow-up. Feeding: Breast. Working with lactation and nursing today to improve feeds. Disposition: Likely discharge home tomorrow.    LOS: 2 days   Jake T Francisco 07/12/2020, 7:36 AM   I personally saw and evaluated the patient, performing the key elements of the service. I developed and verified the management plan that is described in the  resident's/student's note, and I agree with the content with my edits above. VSS, HRR&R, Resp unlabored, Legs neg.  FNigel Berthold CNM 07/12/2020 8:33 AM

## 2020-07-12 NOTE — Anesthesia Postprocedure Evaluation (Signed)
Anesthesia Post Note  Patient: Susan Lara  Procedure(s) Performed: AN AD HOC LABOR EPIDURAL     Patient location during evaluation: Mother Baby Anesthesia Type: Epidural Level of consciousness: awake and alert and oriented Pain management: satisfactory to patient Vital Signs Assessment: post-procedure vital signs reviewed and stable Respiratory status: respiratory function stable Cardiovascular status: stable Postop Assessment: no headache, no backache, epidural receding, patient able to bend at knees, no signs of nausea or vomiting, adequate PO intake and able to ambulate Anesthetic complications: no   No complications documented.  Last Vitals:  Vitals:   07/12/20 0221 07/12/20 0524  BP: (!) 90/55 (!) 98/47  Pulse: 60 66  Resp: 16 16  Temp: 36.6 C (!) 36.3 C  SpO2: 100% 100%    Last Pain:  Vitals:   07/12/20 0524  TempSrc: Oral  PainSc: 0-No pain   Pain Goal:                   Susan Lara

## 2020-07-13 ENCOUNTER — Other Ambulatory Visit: Payer: Self-pay

## 2020-07-13 MED ORDER — IBUPROFEN 100 MG/5ML PO SUSP
600.0000 mg | Freq: Four times a day (QID) | ORAL | Status: DC
  Administered 2020-07-13 (×2): 600 mg via ORAL
  Filled 2020-07-13 (×2): qty 30

## 2020-07-13 MED ORDER — IBUPROFEN 100 MG/5ML PO SUSP: 600.0000 mg | Freq: Four times a day (QID) | ORAL | 0 refills | Status: DC | PRN

## 2020-07-13 NOTE — Progress Notes (Signed)
CSW received consult for "MOB needs help buying newborn needs.". CSW visited MOB at bedside to offer support and provide resources. On arrival, CSW introduced self and stated reason for visit. FOB and infant were present.  CSW assessed needs. MOB reported she needs assistance with formula. CSW provided MOB with contact information for [facility] and [employer]. MOB stated she has an appointment at [facility] office on Monday morning. CSW provided MOB with contact information for food stamp office and online application. MOB denied any additional needs.     CSW identifies no further need for intervention and no barriers to discharge at this time.  Cornel Werber D. Dortha Kern, MSW, LCSW Clinical Social Worker [phone]

## 2020-07-31 DIAGNOSIS — F3162 Bipolar disorder, current episode mixed, moderate: Secondary | ICD-10-CM | POA: Diagnosis not present

## 2020-08-07 DIAGNOSIS — Z419 Encounter for procedure for purposes other than remedying health state, unspecified: Secondary | ICD-10-CM | POA: Diagnosis not present

## 2020-08-15 ENCOUNTER — Ambulatory Visit: Payer: Medicaid Other | Admitting: Advanced Practice Midwife

## 2020-09-04 DIAGNOSIS — F3162 Bipolar disorder, current episode mixed, moderate: Secondary | ICD-10-CM | POA: Diagnosis not present

## 2020-09-06 DIAGNOSIS — Z419 Encounter for procedure for purposes other than remedying health state, unspecified: Secondary | ICD-10-CM | POA: Diagnosis not present

## 2020-10-07 DIAGNOSIS — Z419 Encounter for procedure for purposes other than remedying health state, unspecified: Secondary | ICD-10-CM | POA: Diagnosis not present

## 2020-11-04 DIAGNOSIS — Z20822 Contact with and (suspected) exposure to covid-19: Secondary | ICD-10-CM | POA: Diagnosis not present

## 2020-11-26 ENCOUNTER — Ambulatory Visit: Payer: Medicaid Other | Admitting: Family Medicine

## 2020-12-03 ENCOUNTER — Ambulatory Visit: Payer: Self-pay | Admitting: Family Medicine

## 2021-01-20 ENCOUNTER — Emergency Department (HOSPITAL_COMMUNITY)
Admission: EM | Admit: 2021-01-20 | Discharge: 2021-01-20 | Disposition: A | Discharge status: Home / Self Care | Payer: No Typology Code available for payment source | Attending: Emergency Medicine | Admitting: Emergency Medicine

## 2021-01-20 ENCOUNTER — Encounter (HOSPITAL_COMMUNITY): Payer: Self-pay | Admitting: *Deleted

## 2021-01-20 ENCOUNTER — Other Ambulatory Visit: Payer: Self-pay

## 2021-01-20 ENCOUNTER — Other Ambulatory Visit (HOSPITAL_COMMUNITY): Payer: Self-pay | Admitting: Physician Assistant

## 2021-01-20 DIAGNOSIS — R350 Frequency of micturition: Secondary | ICD-10-CM | POA: Insufficient documentation

## 2021-01-20 DIAGNOSIS — Z20822 Contact with and (suspected) exposure to covid-19: Secondary | ICD-10-CM | POA: Diagnosis not present

## 2021-01-20 DIAGNOSIS — R110 Nausea: Secondary | ICD-10-CM | POA: Diagnosis not present

## 2021-01-20 DIAGNOSIS — Z79899 Other long term (current) drug therapy: Secondary | ICD-10-CM | POA: Diagnosis not present

## 2021-01-20 DIAGNOSIS — J029 Acute pharyngitis, unspecified: Secondary | ICD-10-CM | POA: Diagnosis not present

## 2021-01-20 DIAGNOSIS — M791 Myalgia, unspecified site: Secondary | ICD-10-CM | POA: Diagnosis not present

## 2021-01-20 DIAGNOSIS — R6883 Chills (without fever): Secondary | ICD-10-CM | POA: Insufficient documentation

## 2021-01-20 DIAGNOSIS — M545 Low back pain, unspecified: Secondary | ICD-10-CM | POA: Diagnosis present

## 2021-01-20 LAB — URINALYSIS, ROUTINE W REFLEX MICROSCOPIC
Bilirubin Urine: NEGATIVE
Glucose, UA: NEGATIVE mg/dL
Ketones, ur: 20 mg/dL — AB
Nitrite: NEGATIVE
Protein, ur: 30 mg/dL — AB
Specific Gravity, Urine: 1.018 (ref 1.005–1.030)
WBC, UA: >50 WBC/hpf — ABNORMAL HIGH (ref 0–5)
pH: 5 (ref 5.0–8.0)

## 2021-01-20 LAB — PREGNANCY, URINE: Preg Test, Ur: NEGATIVE

## 2021-01-20 LAB — SARS CORONAVIRUS 2 (TAT 6-24 HRS): SARS Coronavirus 2: NEGATIVE

## 2021-01-20 MED ORDER — CEPHALEXIN 250 MG PO CAPS
500.0000 mg | ORAL_CAPSULE | Freq: Once | ORAL | Status: AC
  Administered 2021-01-20: 500 mg via ORAL
  Filled 2021-01-20: qty 2

## 2021-01-20 MED ORDER — CEPHALEXIN 500 MG PO CAPS: 500.0000 mg | ORAL_CAPSULE | Freq: Four times a day (QID) | ORAL | 0 refills | Status: DC

## 2021-01-20 MED ORDER — ACETAMINOPHEN 325 MG PO TABS
650.0000 mg | ORAL_TABLET | Freq: Once | ORAL | Status: AC
  Administered 2021-01-20: 650 mg via ORAL
  Filled 2021-01-20: qty 2

## 2021-01-20 MED ORDER — LIDOCAINE 5 % EX PTCH
1.0000 | MEDICATED_PATCH | Freq: Once | CUTANEOUS | Status: DC
  Administered 2021-01-20: 1 via TRANSDERMAL
  Filled 2021-01-20: qty 1

## 2021-01-20 MED FILL — CEPHALEXIN 500 MG CAPSULE: 500 | 10 days supply | Qty: 40 | Fill #0

## 2021-01-20 NOTE — ED Notes (Signed)
Pt ambulatory to the restroom without difficulty.

## 2021-01-20 NOTE — ED Notes (Signed)
Patient Alert and oriented to baseline. Stable and ambulatory to baseline. Patient verbalized understanding of the discharge instructions.  Patient belongings were taken by the patient.   

## 2021-01-20 NOTE — ED Triage Notes (Signed)
Pt reports left side back pain x 4 days. Having nausea and fatigue. Denies urinary symptoms.

## 2021-01-20 NOTE — ED Provider Notes (Signed)
[facility] EMERGENCY DEPARTMENT Provider Note   CSN: [id number] Arrival date & time: 01/20/21  0720     History Chief Complaint  Patient presents with  . Back Pain    Susan Lara is a 25 y.o. female with noncontributory past medical history. Had covid vaccinations.  HPI Patient presents to emergency department today with chief complaint of constant  back pain x 4 days. Pain is in her left lower back and will occasionally radiate to her left mid back. She describes pain as throbbing sensation.  Pain 7/10 in severity. She reports no allevaiting or aggravating symptoms. She endorses chills, nausea, fatigue, sore throat, generalized body aches and urinary frequency x 1 day. She tried taking ibuprofen yesterday without symptom improvement. She denies any recent heavy lifting or history of STI, UTI, kidney stones. LMP x 4 days ago. She is sexually active with one female partner and they use protection. She reports recent negative outpatient STD testing. Denies headache, neck pain, cough, congestion, dysuria, abdominal pain, pelvic pain, vaginal discharge, abnormal vaginal bleeding, diarrhea.    Past Medical History:  Diagnosis Date  . Medical history non-contributory     Patient Active Problem List   Diagnosis Date Noted  . Normal labor 07/10/2020  . Candida vaginitis 07/08/2020  . False labor 07/08/2020  . IUGR (intrauterine growth restriction)-RESOLVED 05/17/2020  . Supervision of normal pregnancy 01/10/2020    Past Surgical History:  Procedure Laterality Date  . ESOPHAGEAL MANOMETRY N/A 03/07/2018   Procedure: ESOPHAGEAL MANOMETRY (EM);  Surgeon: Jeani Hawking, MD;  Location: [facility] ENDOSCOPY;  Service: Endoscopy;  Laterality: N/A;  . SINUS EXPLORATION  2011     OB History    Gravida  1   Para  1   Term  1   Preterm      AB      Living  1     SAB      IAB      Ectopic      Multiple  0   Live Births  1           Family History   Problem Relation Age of Onset  . Cancer Paternal Grandmother        breast  . Cancer Maternal Grandfather        brain    Social History   Tobacco Use  . Smoking status: Never Smoker  . Smokeless tobacco: Never Used  Vaping Use  . Vaping Use: Never used  Substance Use Topics  . Alcohol use: Not Currently  . Drug use: Never    Home Medications Prior to Admission medications   Medication Sig Start Date End Date Taking? Authorizing Provider  cephALEXin (KEFLEX) 500 MG capsule Take 1 capsule (500 mg total) by mouth 4 (four) times daily. 01/20/21   Walisiewicz, Yvonna Alanis E, PA-C  docusate sodium (COLACE) 250 MG capsule Take 1 capsule (250 mg total) by mouth daily. Patient not taking: Reported on 07/11/2020 06/22/20   Rolm Bookbinder, CNM  ferrous sulfate 325 (65 FE) MG tablet Take 1 tablet (325 mg total) by mouth daily. Patient not taking: Reported on 07/11/2020 06/22/20   Rolm Bookbinder, CNM  ibuprofen (ADVIL) 100 MG/5ML suspension Take 30 mLs (600 mg total) by mouth every 6 (six) hours as needed for moderate pain. 07/13/20   Judeth Horn, NP  Prenatal Vit-Iron Carbonyl-FA (PRENATAL PLUS IRON) 29-1 MG TABS Take 1 tablet by mouth daily. 11/22/19   Adline Potter, NP  Allergies    Patient has no known allergies.  Review of Systems   Review of Systems All other systems are reviewed and are negative for acute change except as noted in the HPI.  Physical Exam Updated Vital Signs BP 106/63 (BP Location: Right Arm)   Pulse 97   Temp 100 F (37.8 C) (Oral)   Resp 17   LMP 01/15/2021   SpO2 100%   Physical Exam Vitals and nursing note reviewed.  Constitutional:      General: She is not in acute distress.    Appearance: She is not ill-appearing.  HENT:     Head: Normocephalic and atraumatic.     Right Ear: Tympanic membrane and external ear normal.     Left Ear: Tympanic membrane and external ear normal.     Nose: Nose normal.     Mouth/Throat:     Mouth: Mucous  membranes are moist.     Pharynx: Oropharynx is clear.  Eyes:     General: No scleral icterus.       Right eye: No discharge.        Left eye: No discharge.     Extraocular Movements: Extraocular movements intact.     Conjunctiva/sclera: Conjunctivae normal.     Pupils: Pupils are equal, round, and reactive to light.  Neck:     Vascular: No JVD.  Cardiovascular:     Rate and Rhythm: Normal rate and regular rhythm.     Pulses: Normal pulses.          Radial pulses are 2+ on the right side and 2+ on the left side.     Heart sounds: Normal heart sounds.  Pulmonary:     Comments: Lungs clear to auscultation in all fields. Symmetric chest rise. No wheezing, rales, or rhonchi. Abdominal:     Tenderness: There is left CVA tenderness.     Comments: Abdomen is soft, non-distended, and non-tender in all quadrants. No rigidity, no guarding. No peritoneal signs.  Genitourinary:    Comments: Patient defers GU exam Musculoskeletal:        General: Normal range of motion.     Cervical back: Normal range of motion.       Back:     Comments: Tender to palpation as depicted in image above. No overlying skin changes.  No cervical, thoracic, or lumbar spinal tenderness to palpation.  No step offs, crepitus or deformity palpated.  Ambulatory with normal gait.   Skin:    General: Skin is warm and dry.     Capillary Refill: Capillary refill takes less than 2 seconds.     Findings: No rash.  Neurological:     Mental Status: She is oriented to person, place, and time.     GCS: GCS eye subscore is 4. GCS verbal subscore is 5. GCS motor subscore is 6.     Comments: Fluent speech, no facial droop.  Sensation grossly intact to light touch in the lower extremities bilaterally. No saddle anesthesias. Strength 5/5 with flexion and extension at the bilateral hips, knees, and ankles. No noted gait deficit. Coordination intact with heel to shin testing.  Psychiatric:        Behavior: Behavior normal.      ED Results / Procedures / Treatments   Labs (all labs ordered are listed, but only abnormal results are displayed) Labs Reviewed  URINALYSIS, ROUTINE W REFLEX MICROSCOPIC - Abnormal; Notable for the following components:      Result Value  APPearance CLOUDY (*)    Hgb urine dipstick LARGE (*)    Ketones, ur 20 (*)    Protein, ur 30 (*)    Leukocytes,Ua LARGE (*)    WBC, UA >50 (*)    Bacteria, UA FEW (*)    All other components within normal limits  SARS CORONAVIRUS 2 (TAT 6-24 HRS)  URINE CULTURE  PREGNANCY, URINE    EKG None  Radiology No results found.  Procedures Procedures   Medications Ordered in ED Medications  lidocaine (LIDODERM) 5 % 1 patch (1 patch Transdermal Patch Applied 01/20/21 0815)  acetaminophen (TYLENOL) tablet 650 mg (650 mg Oral Given 01/20/21 0815)  cephALEXin (KEFLEX) capsule 500 mg (500 mg Oral Given 01/20/21 1030)    ED Course  I have reviewed the triage vital signs and the nursing notes.  Pertinent labs & imaging results that were available during my care of the patient were reviewed by me and considered in my medical decision making (see chart for details).    MDM Rules/Calculators/A&P                          History provided by patient with additional history obtained from chart review.    Presenting with back pain and generalized body aches.She is very well appearing, in no acute distress. Low grade temp 100, HDS. Back pain reproducible on exam, does also have left CVA tenderness. abdominal exam is benign, no peritoneal signs. Lower extremity neuro exam is normal. Tylenol given for pain and lidoderm patch applied. UA with possible infection large leukocytes, > 50 WBC, few bacteria. Does also have squamous epithelial cells, question if contaminated sample. Also with 20 ketones. Will send for urine culture.  Urine pregnancy test is negative. Reassessed patient and pain slightly improved. Serial abdominal exams are benign.  Discussed  results with patient and engaged in shared decision making. She agrees with plan to treat for possible pyelonephritis based on symptoms and UA with antibiotic and hold off on lab work or imaging. She is tolerating PO intake and would rather have PO fluids. She politely declines pelvic exam, also discussed risks and benefits of not performing exam. Patient know to return if symptoms worsen despite antibiotic use. Given first dose here in ED. Covid test in process at time of discharge. Discusses symptomatic treatment if covid test is positive. Patient aware she needs to quarantine until test results.   The patient appears reasonably screened and/or stabilized for discharge and I doubt any other medical condition or other [facility] requiring further screening, evaluation, or treatment in the ED at this time prior to discharge. The patient is safe for discharge with strict return precautions discussed. Recommend pcp follow up. Findings and plan of care discussed with supervising physician Dr. Denton Lank who agrees with plan of care.   Susan Lara was evaluated in Emergency Department on 01/20/2021 for the symptoms described in the history of present illness. She was evaluated in the context of the global COVID-19 pandemic, which necessitated consideration that the patient might be at risk for infection with the SARS-CoV-2 virus that causes COVID-19. Institutional protocols and algorithms that pertain to the evaluation of patients at risk for COVID-19 are in a state of rapid change based on information released by regulatory bodies including the CDC and federal and state organizations. These policies and algorithms were followed during the patient's care in the ED.   Portions of this note were generated with Scientist, clinical (histocompatibility and immunogenetics).  Dictation errors may occur despite best attempts at proofreading.    Final Clinical Impression(s) / ED Diagnoses Final diagnoses:  Acute left-sided low back pain without sciatica     Rx / DC Orders ED Discharge Orders         Ordered    cephALEXin (KEFLEX) 500 MG capsule  4 times daily,   Status:  Discontinued        01/20/21 1004    cephALEXin (KEFLEX) 500 MG capsule  4 times daily        01/20/21 1027           Kandice HamsWalisiewicz,  E, PA-C 01/20/21 1036    Cathren LaineSteinl, Kevin, MD 01/21/21 1425

## 2021-01-20 NOTE — Discharge Instructions (Addendum)
Thank you for allowing [place] to care for you today.   -Your urine sample today shows you might have a urinary tract infection, given your symptoms it is possible that you have pyelonephritis as we discussed. -Antibiotic sent to your pharmacy called Keflex. Take this as prescribed. If after taking it for 24 to 48 h your symptoms have not improved and your Covid test is negative you should return to the emergency department for further evaluation  Please return to the emergency department if you have any new or worsening symptoms. -You should try taking tylenol and ibuprofen for pain. Take as directedo n the bottle. You can try things like applying heating pad or ice to your back to help with pain also.  You have a pending covid test. If positive you will receive a phone call. Results will be available on your MyChart. You should quarantine until you have test result.  Medications- You can take medications to help treat your symptoms: -Tylenol for fever and body aches. Please take as prescribed on the bottle. -Over the coutner cough medicine such as mucinex, robitussin, or other brands. -Flonase or saline nasal spray for nasal congestion -Vitamins as recommended by CDC: vitamin C, D and Zinc  Treatment- This is a virus and unfortunately there are no antibitotics approved to treat this virus at this time. It is important to monitor your symptoms closely: -You should have a theremometer at home to check your temperature when feeling feverish. -Use a pulse ox meter to measure your oxygen when feeling short of breath.  -If your fever is over 100.4 despite taking tylenol or if your oxygen level drops below 92% these are reasons to return to the emergency department for further evaluation.   -CDC quarantine guidelines are asking you to isolate for 5 days then -You can return to work, school or normal activities if on day 6 you are fever free without the use of Tylenol or ibuprofen. You need to wear a mask  for the next 5 days. -If you are still having fever or sever symptoms on day 5 you need to continue isolation until day 10.   -If family members are wanting to get tested there are multiple sites in the commnuity to get an outpatient test. Go online and search for "covid testing near me." If family members are having symptoms they should follow the same quarantine guidelines.  Again: symptoms of shortness of breath, chest pain, difficulty breathing, new onset of confusion, any symptoms that are concerning. If any of these symptoms you should come to emergency department for evaluation.   I hope you feel better soon

## 2021-01-22 ENCOUNTER — Other Ambulatory Visit: Payer: Self-pay

## 2021-01-22 ENCOUNTER — Ambulatory Visit: Payer: No Typology Code available for payment source | Admitting: Internal Medicine

## 2021-01-22 ENCOUNTER — Ambulatory Visit (INDEPENDENT_AMBULATORY_CARE_PROVIDER_SITE_OTHER): Payer: No Typology Code available for payment source | Admitting: Internal Medicine

## 2021-01-22 ENCOUNTER — Encounter: Payer: Self-pay | Admitting: Internal Medicine

## 2021-01-22 ENCOUNTER — Ambulatory Visit (HOSPITAL_COMMUNITY)
Admission: RE | Admit: 2021-01-22 | Discharge: 2021-01-22 | Disposition: A | Discharge status: Home / Self Care | Payer: No Typology Code available for payment source | Source: Ambulatory Visit | Attending: Internal Medicine | Admitting: Internal Medicine

## 2021-01-22 VITALS — BP 98/63 | HR 85 | Temp 98.5°F | Resp 18 | Ht 63.0 in | Wt 106.1 lb

## 2021-01-22 DIAGNOSIS — N12 Tubulo-interstitial nephritis, not specified as acute or chronic: Secondary | ICD-10-CM

## 2021-01-22 DIAGNOSIS — S0990XA Unspecified injury of head, initial encounter: Secondary | ICD-10-CM | POA: Insufficient documentation

## 2021-01-22 DIAGNOSIS — Z2821 Immunization not carried out because of patient refusal: Secondary | ICD-10-CM

## 2021-01-22 DIAGNOSIS — Z7689 Persons encountering health services in other specified circumstances: Secondary | ICD-10-CM | POA: Diagnosis not present

## 2021-01-22 NOTE — Assessment & Plan Note (Signed)
Care established Previous chart reviewed History and medications reviewed with the patient 

## 2021-01-22 NOTE — Patient Instructions (Signed)
Please continue to take Cephalexin as prescribed.  Please stay hydrated by taking at least 1.5 liters of fluid in a day.  Please avoid skipping any meal.  Please take Tylenol as needed up to 3 times in a day for headache. If you develop any new symptoms such as blurry vision, walking difficulty or shaking movements, please go to ER immediately.

## 2021-01-22 NOTE — Assessment & Plan Note (Signed)
UA positive for LE Urine culture showed E Coli and E Aerogenes Went to ER, was given Keflex Improved symptoms now

## 2021-01-22 NOTE — Progress Notes (Signed)
New Patient Office Visit  Subjective:  Patient ID: Susan Lara, female    DOB: 04-17-1996  Age: 25 y.o. MRN: [id number]  CC:  Chief Complaint  Patient presents with  . New Patient (Initial Visit)    New patient was just at [facility] for back pain left side pain and she feels like she has air in her head when walking also has headaches every day     HPI Susan Lara is a 25 year old female with no significant PMH who presents for establishing care.  She recently went to ER for c/o left flank pain and was given Cephalexin for possible pyelonephritis. Her urine culture was positive for E coli. Her flank pain has been improving now. She denies dysuria, hematuria, fever, chills, nausea or vomiting.  She mentions having a head injury at home as she hit the bathroom door about 4 days ago. She has been having constant dull headache over left side associated with dizziness and intermittent gait problem. She denies any blurred vision currently. She did not get evaluated for head injury. She denies any shaking movement or urinary incontinence. She denies having any major episode of headache before this incident.  She has had 1 normal vaginal pregnancy in 07/2020. Has not had any PAP smear yet.  She has had 2 doses of COVID vaccine. Denies flu vaccine for now.  Past Medical History:  Diagnosis Date  . Candida vaginitis 07/08/2020  . Medical history non-contributory   . Normal labor 07/10/2020    Past Surgical History:  Procedure Laterality Date  . ESOPHAGEAL MANOMETRY N/A 03/07/2018   Procedure: ESOPHAGEAL MANOMETRY (EM);  Surgeon: Jeani Hawking, MD;  Location: [facility];  Service: Endoscopy;  Laterality: N/A;  . SINUS EXPLORATION  2011    Family History  Problem Relation Age of Onset  . Cancer Paternal Grandmother        breast  . Cancer Maternal Grandfather        brain    Social History   Socioeconomic History  . Marital status: Single    Spouse name: Not on  file  . Number of children: Not on file  . Years of education: Not on file  . Highest education level: Some college, no degree  Occupational History  . Not on file  Tobacco Use  . Smoking status: Never Smoker  . Smokeless tobacco: Never Used  Vaping Use  . Vaping Use: Never used  Substance and Sexual Activity  . Alcohol use: Not Currently  . Drug use: Never  . Sexual activity: Yes    Birth control/protection: None  Other Topics Concern  . Not on file  Social History Narrative  . Not on file   Social Determinants of Health   Financial Resource Strain: Low Risk   . Difficulty of Paying Living Expenses: Not hard at all  Food Insecurity: No Food Insecurity  . Worried About Programme researcher, broadcasting/film/video in the Last Year: Never true  . Ran Out of Food in the Last Year: Never true  Transportation Needs: No Transportation Needs  . Lack of Transportation (Medical): No  . Lack of Transportation (Non-Medical): No  Physical Activity: Insufficiently Active  . Days of Exercise per Week: 4 days  . Minutes of Exercise per Session: 30 min  Stress: No Stress Concern Present  . Feeling of Stress : Not at all  Social Connections: Unknown  . Frequency of Communication with Friends and Family: More than three times a week  .  Frequency of Social Gatherings with Friends and Family: More than three times a week  . Attends Religious Services: More than 4 times per year  . Active Member of Clubs or Organizations: No  . Attends Banker Meetings: Never  . Marital Status: Patient refused  Intimate Partner Violence: Not At Risk  . Fear of Current or Ex-Partner: No  . Emotionally Abused: No  . Physically Abused: No  . Sexually Abused: No    ROS Review of Systems  Constitutional: Negative for chills and fever.  HENT: Negative for congestion, sinus pressure, sinus pain and sore throat.   Eyes: Negative for pain and discharge.  Respiratory: Negative for cough and shortness of breath.    Cardiovascular: Negative for chest pain and palpitations.  Gastrointestinal: Negative for abdominal pain, constipation, diarrhea, nausea and vomiting.  Endocrine: Negative for polydipsia and polyuria.  Genitourinary: Negative for dysuria and hematuria.  Musculoskeletal: Negative for neck pain and neck stiffness.  Skin: Negative for rash.  Neurological: Positive for dizziness and headaches. Negative for seizures and weakness.  Psychiatric/Behavioral: Negative for agitation and behavioral problems.    Objective:   Today's Vitals: BP 98/63 (BP Location: Right Arm, Patient Position: Sitting, Cuff Size: Normal)   Pulse 85   Temp 98.5 F (36.9 C) (Oral)   Resp 18   Ht 5\' 3"  (1.6 m)   Wt 106 lb 1.9 oz (48.1 kg)   LMP 01/15/2021   SpO2 99%   BMI 18.80 kg/m   Physical Exam Vitals reviewed.  Constitutional:      General: She is not in acute distress.    Appearance: She is not diaphoretic.  HENT:     Head: Normocephalic.     Nose: Nose normal.     Mouth/Throat:     Mouth: Mucous membranes are moist.  Eyes:     General: No scleral icterus.    Extraocular Movements: Extraocular movements intact.     Pupils: Pupils are equal, round, and reactive to light.  Cardiovascular:     Rate and Rhythm: Normal rate and regular rhythm.     Pulses: Normal pulses.     Heart sounds: Normal heart sounds. No murmur heard.   Pulmonary:     Breath sounds: Normal breath sounds. No wheezing or rales.  Abdominal:     Palpations: Abdomen is soft.     Tenderness: There is no abdominal tenderness.  Musculoskeletal:     Cervical back: Neck supple. No tenderness.     Right lower leg: No edema.     Left lower leg: No edema.  Skin:    General: Skin is warm.     Findings: No rash.  Neurological:     General: No focal deficit present.     Mental Status: She is alert and oriented to person, place, and time.     Cranial Nerves: No cranial nerve deficit.     Sensory: No sensory deficit.     Motor: No  weakness.  Psychiatric:        Mood and Affect: Mood normal.        Behavior: Behavior normal.     Assessment & Plan:   Problem List Items Addressed This Visit      Encounter to establish care - Primary   Care established Previous chart reviewed History and medications reviewed with the patient      Genitourinary   Pyelonephritis    UA positive for LE Urine culture showed E Coli and E Aerogenes Went  to ER, was given Keflex Improved symptoms now        Other         Head injury    Had injury at home with bathroom door Has been having headache, dizziness and gait problem at times Did not get evaluated after injury Most likely localized swelling But will check CT head without contrast as patient mentions having dizziness and gait problem      Relevant Orders   CT Head Wo Contrast    Other Visit Diagnoses    Influenza vaccine refused          Outpatient Encounter Medications as of 01/22/2021  Medication Sig  . cephALEXin (KEFLEX) 500 MG capsule Take 1 capsule (500 mg total) by mouth 4 (four) times daily.  . [DISCONTINUED] ibuprofen (ADVIL) 100 MG/5ML suspension Take 30 mLs (600 mg total) by mouth every 6 (six) hours as needed for moderate pain.  . [DISCONTINUED] docusate sodium (COLACE) 250 MG capsule Take 1 capsule (250 mg total) by mouth daily. (Patient not taking: No sig reported)  . [DISCONTINUED] ferrous sulfate 325 (65 FE) MG tablet Take 1 tablet (325 mg total) by mouth daily. (Patient not taking: No sig reported)  . [DISCONTINUED] Prenatal Vit-Iron Carbonyl-FA (PRENATAL PLUS IRON) 29-1 MG TABS Take 1 tablet by mouth daily. (Patient not taking: Reported on 01/22/2021)   No facility-administered encounter medications on file as of 01/22/2021.    Follow-up: Return in about 3 months (around 04/21/2021) for Annual physical.   Anabel Halon, MD

## 2021-01-22 NOTE — Assessment & Plan Note (Signed)
Had injury at home with bathroom door Has been having headache, dizziness and gait problem at times Did not get evaluated after injury Most likely localized swelling But will check CT head without contrast as patient mentions having dizziness and gait problem

## 2021-01-23 LAB — URINE CULTURE
Culture: >=100000 — AB
Special Requests: NORMAL

## 2021-01-24 NOTE — Progress Notes (Signed)
ED Antimicrobial Stewardship Positive Culture Follow Up   Susan Lara is an 25 y.o. female who presented to [facility] on 01/22/2021 with a chief complaint of flank pain/pyelonephritis.   Recent Results (from the past 720 hour(s))  SARS CORONAVIRUS 2 (TAT 6-24 HRS) Nasopharyngeal Nasopharyngeal Swab     Status: None   Collection Time: 01/20/21  8:14 AM   Specimen: Nasopharyngeal Swab  Result Value Ref Range Status   SARS Coronavirus 2 NEGATIVE NEGATIVE Final    Comment: (NOTE) SARS-CoV-2 target nucleic acids are NOT DETECTED.  The SARS-CoV-2 RNA is generally detectable in upper and lower respiratory specimens during the acute phase of infection. Negative results do not preclude SARS-CoV-2 infection, do not rule out co-infections with other pathogens, and should not be used as the sole basis for treatment or other patient management decisions. Negative results must be combined with clinical observations, patient history, and epidemiological information. The expected result is Negative.  Fact Sheet for Patients: HairSlick.no  Fact Sheet for Healthcare Providers: quierodirigir.com  This test is not yet approved or cleared by the [place] FDA and  has been authorized for detection and/or diagnosis of SARS-CoV-2 by FDA under an Emergency Use Authorization (EUA). This EUA will remain  in effect (meaning this test can be used) for the duration of the COVID-19 declaration under Se ction 564(b)(1) of the Act, 21 U.S.C. section 360bbb-3(b)(1), unless the authorization is terminated or revoked sooner.  Performed at [facility], [address], [place], [place] [address]   Urine culture     Status: Abnormal   Collection Time: 01/20/21  9:29 AM   Specimen: Urine, Random  Result Value Ref Range Status   Specimen Description URINE, RANDOM  Final   Special Requests   Final    Normal Performed at [facility]  [facility], [address] [place], [place] [address]    Culture (A)  Final    >=100,000 COLONIES/mL ESCHERICHIA COLI 50,000 COLONIES/mL ENTEROBACTER AEROGENES    Report Status 01/23/2021 FINAL  Final   Organism ID, Bacteria ESCHERICHIA COLI (A)  Final   Organism ID, Bacteria ENTEROBACTER AEROGENES (A)  Final      Susceptibility   Enterobacter aerogenes - MIC*    CEFAZOLIN RESISTANT Resistant     CEFEPIME <=0.12 SENSITIVE Sensitive     CEFTRIAXONE <=0.25 SENSITIVE Sensitive     CIPROFLOXACIN <=0.25 SENSITIVE Sensitive     GENTAMICIN <=1 SENSITIVE Sensitive     IMIPENEM 2 SENSITIVE Sensitive     NITROFURANTOIN 64 INTERMEDIATE Intermediate     TRIMETH/SULFA <=20 SENSITIVE Sensitive     PIP/TAZO <=4 SENSITIVE Sensitive     * 50,000 COLONIES/mL ENTEROBACTER AEROGENES   Escherichia coli - MIC*    AMPICILLIN >=32 RESISTANT Resistant     CEFAZOLIN <=4 SENSITIVE Sensitive     CEFEPIME <=0.12 SENSITIVE Sensitive     CEFTRIAXONE <=0.25 SENSITIVE Sensitive     CIPROFLOXACIN <=0.25 SENSITIVE Sensitive     GENTAMICIN <=1 SENSITIVE Sensitive     IMIPENEM <=0.25 SENSITIVE Sensitive     NITROFURANTOIN <=16 SENSITIVE Sensitive     TRIMETH/SULFA <=20 SENSITIVE Sensitive     AMPICILLIN/SULBACTAM 16 INTERMEDIATE Intermediate     PIP/TAZO <=4 SENSITIVE Sensitive     * >=100,000 COLONIES/mL ESCHERICHIA COLI    Patient treated with keflex for pyelonephritis. Patient followed up in family medicine clinic on 2/16 where she endorsed improved symptoms relative to ED visit. Patient's cultures are growing E. Coli sensitive to keflex  and E. Aerogenes resistant to keflex. Spoke with patient on the phone who endorsed resolved lower back symptoms and denied any urinary symptoms at this time. Recommend continue current therapy and return to PCP if symptoms return.    New antibiotic prescription: None  ED Provider: Fayrene Helper, PA-C   Ike Bene 01/24/2021, 9:59 AM Clinical Pharmacist Monday - Friday phone -   [phone] Saturday - Sunday phone - [phone]

## 2021-01-31 ENCOUNTER — Ambulatory Visit (INDEPENDENT_AMBULATORY_CARE_PROVIDER_SITE_OTHER): Payer: No Typology Code available for payment source

## 2021-01-31 ENCOUNTER — Other Ambulatory Visit: Payer: Self-pay

## 2021-01-31 DIAGNOSIS — Z111 Encounter for screening for respiratory tuberculosis: Secondary | ICD-10-CM | POA: Diagnosis not present

## 2021-02-03 ENCOUNTER — Ambulatory Visit: Payer: No Typology Code available for payment source

## 2021-02-03 ENCOUNTER — Other Ambulatory Visit: Payer: Self-pay

## 2021-02-03 LAB — TB SKIN TEST
Induration: 0 mm
TB Skin Test: NEGATIVE

## 2021-02-10 ENCOUNTER — Encounter: Payer: No Typology Code available for payment source | Admitting: Internal Medicine

## 2021-02-11 ENCOUNTER — Other Ambulatory Visit: Payer: Self-pay

## 2021-02-11 ENCOUNTER — Telehealth (INDEPENDENT_AMBULATORY_CARE_PROVIDER_SITE_OTHER): Payer: No Typology Code available for payment source | Admitting: Internal Medicine

## 2021-02-11 ENCOUNTER — Other Ambulatory Visit (HOSPITAL_COMMUNITY): Payer: Self-pay | Admitting: Internal Medicine

## 2021-02-11 ENCOUNTER — Encounter: Payer: Self-pay | Admitting: Internal Medicine

## 2021-02-11 VITALS — Temp 100.1°F

## 2021-02-11 DIAGNOSIS — G8929 Other chronic pain: Secondary | ICD-10-CM

## 2021-02-11 DIAGNOSIS — K529 Noninfective gastroenteritis and colitis, unspecified: Secondary | ICD-10-CM

## 2021-02-11 DIAGNOSIS — R634 Abnormal weight loss: Secondary | ICD-10-CM | POA: Insufficient documentation

## 2021-02-11 DIAGNOSIS — R1033 Periumbilical pain: Secondary | ICD-10-CM | POA: Insufficient documentation

## 2021-02-11 DIAGNOSIS — M545 Low back pain, unspecified: Secondary | ICD-10-CM | POA: Diagnosis not present

## 2021-02-11 DIAGNOSIS — R1310 Dysphagia, unspecified: Secondary | ICD-10-CM | POA: Insufficient documentation

## 2021-02-11 MED ORDER — NAPROXEN 500 MG PO TABS: 500.0000 mg | ORAL_TABLET | Freq: Two times a day (BID) | ORAL | 0 refills | Status: DC

## 2021-02-11 MED FILL — NAPROXEN 500 MG TABS: 500 | 15 days supply | Qty: 30 | Fill #0

## 2021-02-11 NOTE — Patient Instructions (Signed)

## 2021-02-11 NOTE — Progress Notes (Signed)
Virtual Visit via Telephone Note   This visit type was conducted due to national recommendations for restrictions regarding the COVID-19 Pandemic (e.g. social distancing) in an effort to limit this patient's exposure and mitigate transmission in our community.  Due to her co-morbid illnesses, this patient is at least at moderate risk for complications without adequate follow up.  This format is felt to be most appropriate for this patient at this time.  The patient did not have access to video technology/had technical difficulties with video requiring transitioning to audio format only (telephone).  All issues noted in this document were discussed and addressed.  No physical exam could be performed with this format.   Evaluation Performed:  Follow-up visit  Date:  02/11/2021   ID:  Susan Lara, DOB 1995/12/22, MRN [id number]  Patient Location: Home Provider Location: Office/Clinic  Participants: Patient Location of Patient: Home Location of Provider: Telehealth Consent was obtain for visit to be over via telehealth. I verified that I am speaking with the correct person using two identifiers.  PCP:  Anabel Halon, MD   Chief Complaint:  Back pain  History of Present Illness:    Susan Lara is a 25 y.o. female with no significant PMH who has a televisit for c/o loose stools for last 2 days.  She states that she had cookout burger about 3 days ago, and started having loose stools on the next day. She denies any melena or hematochezia. Denies vomiting. She has mild RLQ abdominal pain. She had low grade fever, Tmax 100.1 F. She has been tolerating PO intake so far, but has been fatigued.  She also c/o chronic back pain, which is dull, constant, worse with activity and is nonradiating. She denies any numbness, tingling or weakness of the LE. She denies any dysuria or hematuria.  The patient does not have symptoms concerning for COVID-19 infection (fever, chills, cough, or  new shortness of breath).   Past Medical, Surgical, Social History, Allergies, and Medications have been Reviewed.  Past Medical History:  Diagnosis Date  . Candida vaginitis 07/08/2020  . Medical history non-contributory   . Normal labor 07/10/2020   Past Surgical History:  Procedure Laterality Date  . ESOPHAGEAL MANOMETRY N/A 03/07/2018   Procedure: ESOPHAGEAL MANOMETRY (EM);  Surgeon: Jeani Hawking, MD;  Location: [facility];  Service: Endoscopy;  Laterality: N/A;  . SINUS EXPLORATION  2011     Current Meds  Medication Sig  . naproxen (NAPROSYN) 500 MG tablet Take 1 tablet (500 mg total) by mouth 2 (two) times daily with a meal.     Allergies:   Patient has no known allergies.   ROS:   Please see the history of present illness.     All other systems reviewed and are negative.   Labs/Other Tests and Data Reviewed:    Recent Labs: 06/22/2020: ALT 11; BUN 7; Creatinine, Ser 0.68; Potassium 4.0; Sodium 135 07/12/2020: Hemoglobin 10.1; Platelets 224   Recent Lipid Panel No results found for: CHOL, TRIG, HDL, CHOLHDL, LDLCALC, LDLDIRECT  Wt Readings from Last 3 Encounters:  01/22/21 106 lb 1.9 oz (48.1 kg)  07/10/20 119 lb 6.4 oz (54.2 kg)  07/05/20 120 lb (54.4 kg)      ASSESSMENT & PLAN:    Gastroenteritis Most of the bacterial and viral gastroenteritis are self-resolving If persistent symptoms or new symptoms, will prescribe antibiotics Advised for proper hydration Tolerating PO intake for now  Back pain, chronic Naproxen PRN Avoid heavy  lifting Back exercises, material provided  Work note provided for 2 days.  Time:   Today, I have spent 13 minutes reviewing the chart, including problem list, medications, and with the patient with telehealth technology discussing the above problems.   Medication Adjustments/Labs and Tests Ordered: Current medicines are reviewed at length with the patient today.  Concerns regarding medicines are outlined above.   Tests  Ordered: No orders of the defined types were placed in this encounter.   Medication Changes: Meds ordered this encounter  Medications  . naproxen (NAPROSYN) 500 MG tablet    Sig: Take 1 tablet (500 mg total) by mouth 2 (two) times daily with a meal.    Dispense:  30 tablet    Refill:  0     Note: This dictation was prepared with Dragon dictation along with smaller phrase technology. Similar sounding words can be transcribed inadequately or may not be corrected upon review. Any transcriptional errors that result from this process are unintentional.      Disposition:  Follow up  Signed, Anabel Halon, MD  02/11/2021 9:19 AM     [facility]

## 2021-02-27 IMAGING — US US RENAL
1 series · 15 of 25 positions shown · non-contrast
Comparison: None.

CLINICAL DATA: Initial evaluation for acute right flank pain.

EXAM:
RENAL / URINARY TRACT ULTRASOUND COMPLETE

[Series 1: us renal · 15 of 33 slices shown]
[im 1/33]
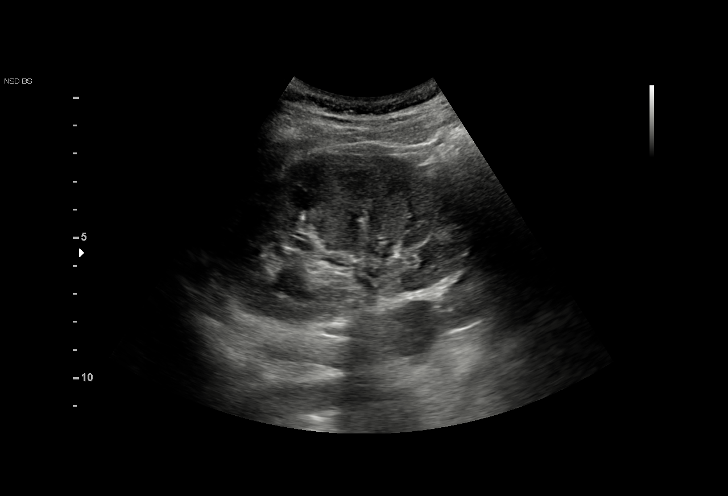
[im 3/33]
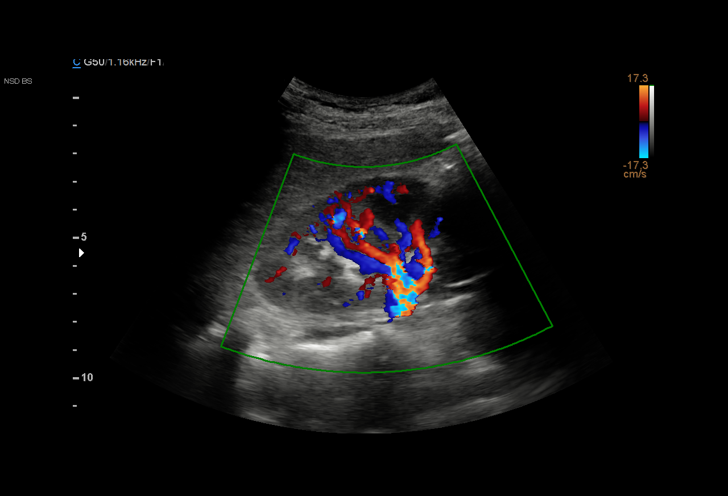
[im 6/33]
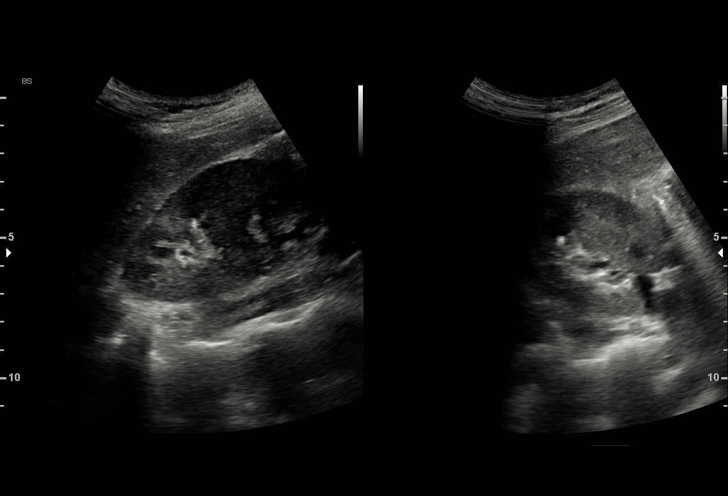
[im 7/33]
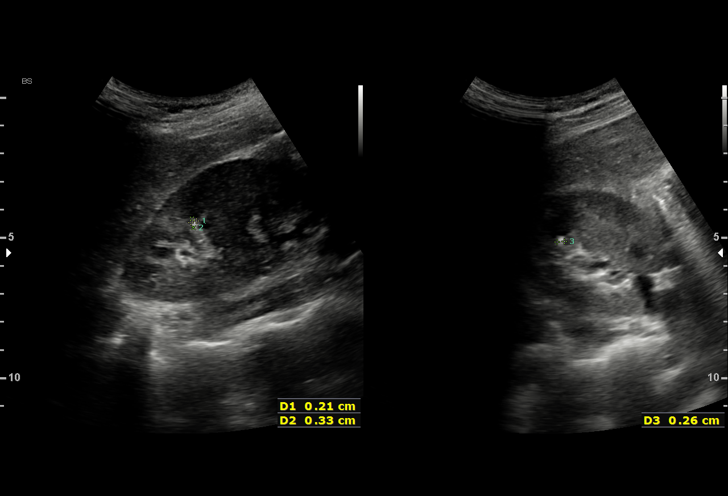
[im 10/33]
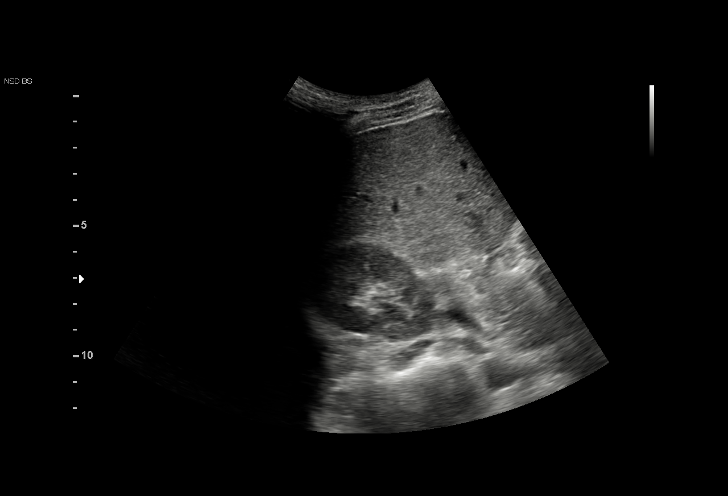
[im 13/33]
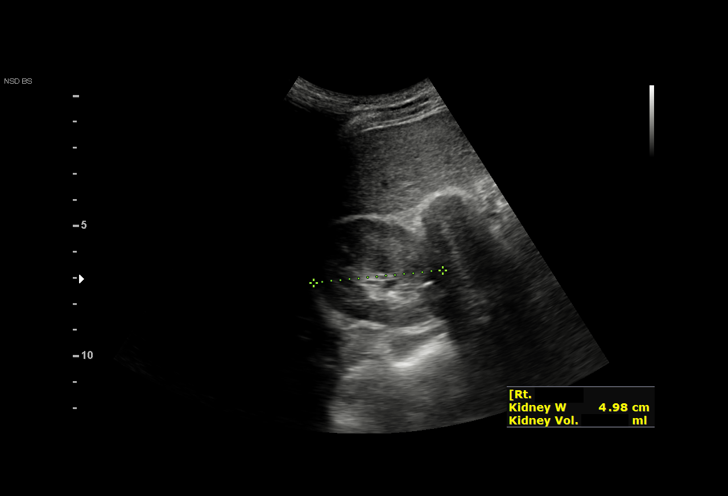
[im 14/33]
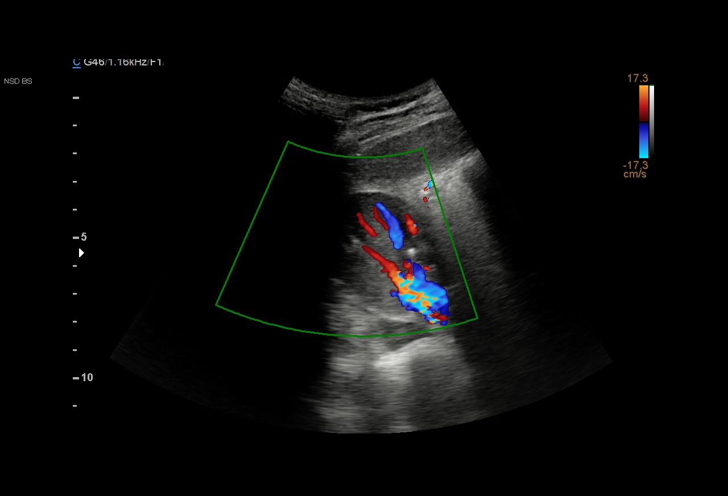
[im 17/33]
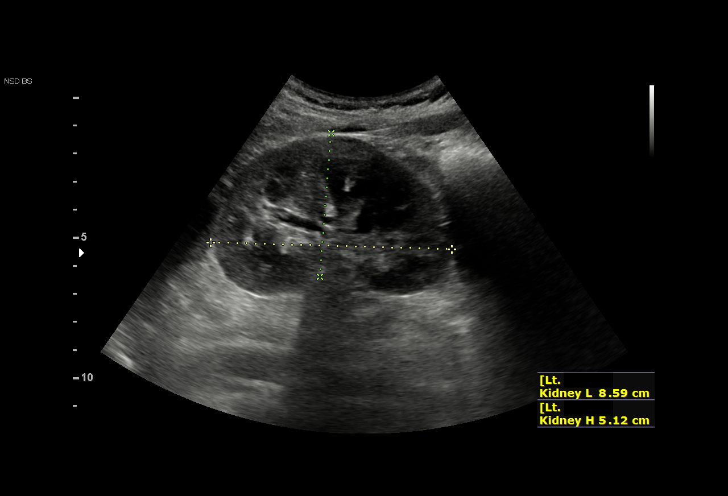
[im 19/33]
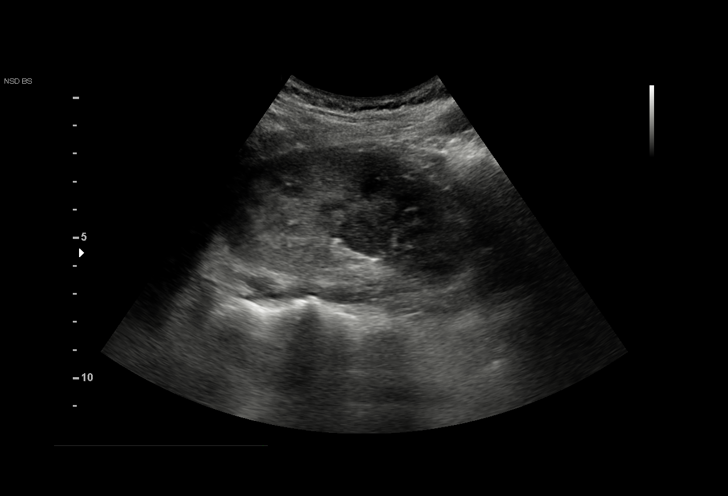
[im 21/33]
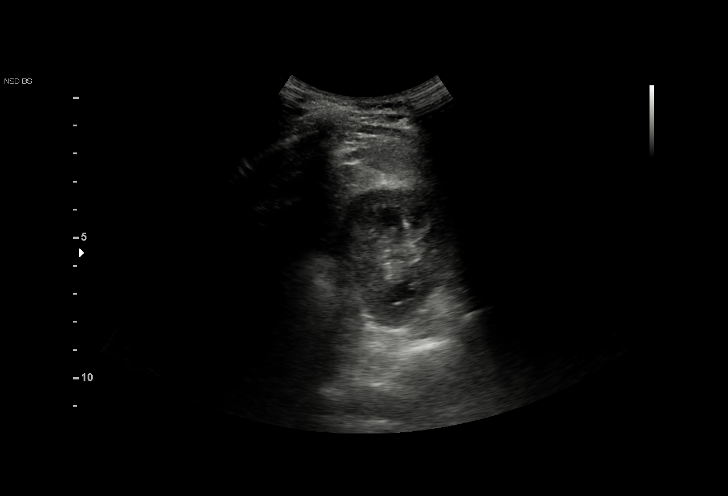
[im 23/33]
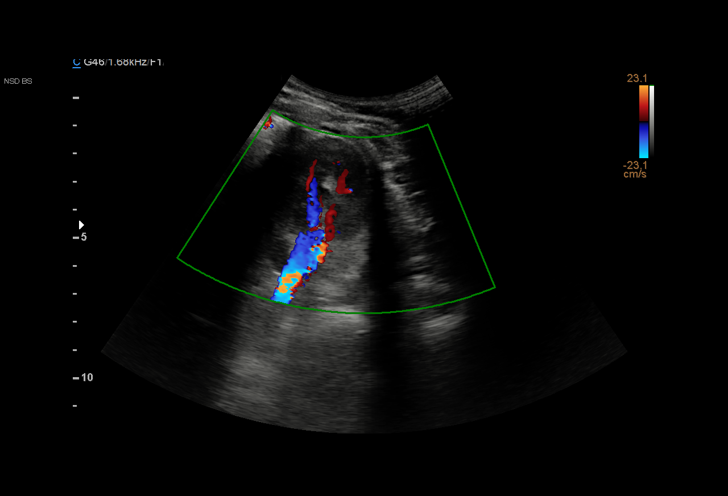
[im 26/33]
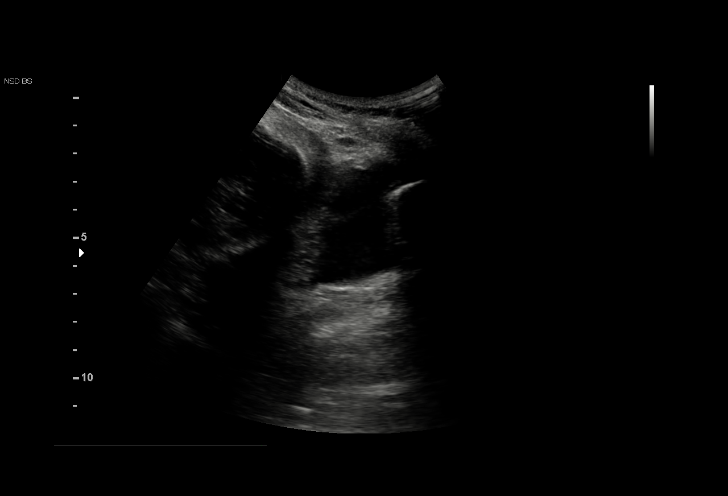
[im 27/33]
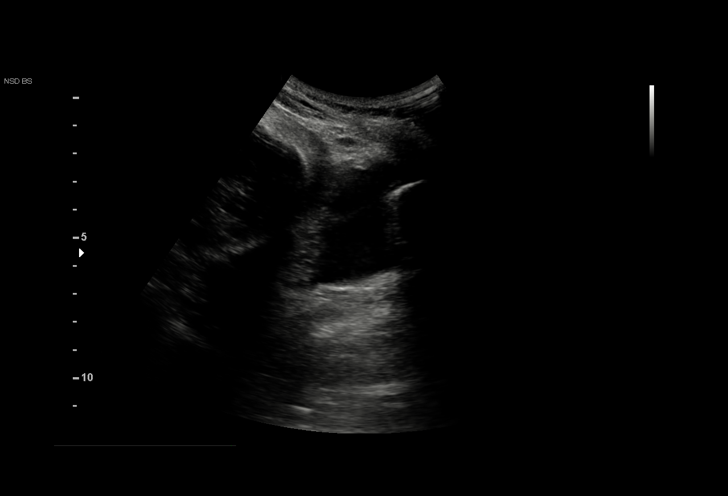
[im 30/33]
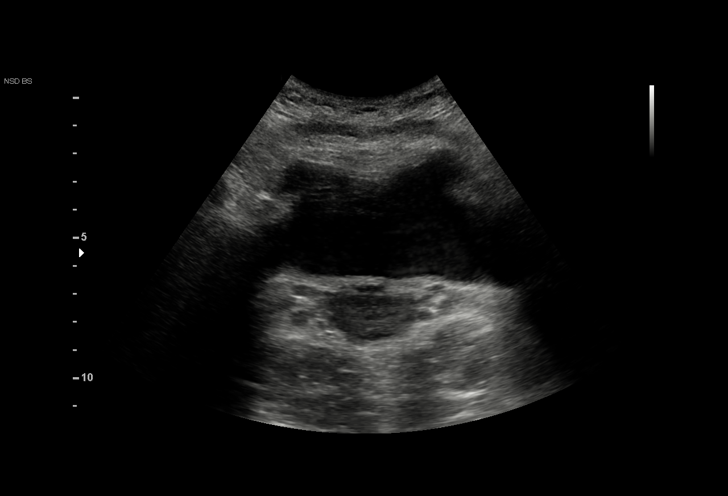
[im 33/33]
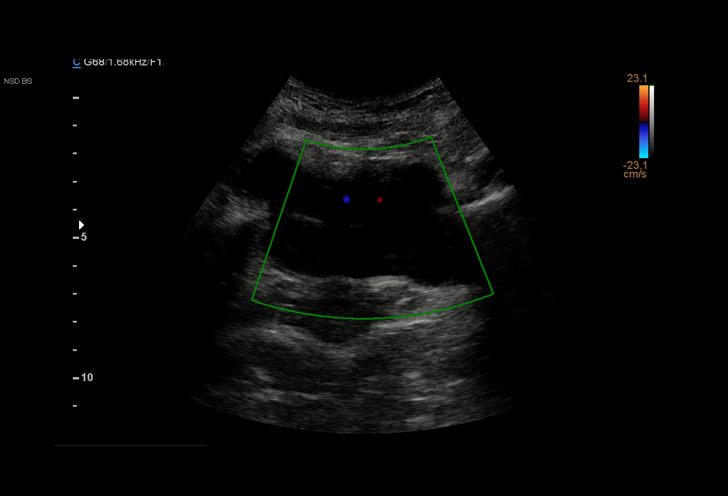

[15 of 25 positions shown; findings below may reference images not displayed]

FINDINGS: Right Kidney:

Renal measurements: 9.3 x 5.7 x 5.0 cm = volume: 138 mL. Renal
echogenicity within normal limits. 3 mm nonobstructive calculus
present at the upper pole. No hydronephrosis. No focal renal mass.

Left Kidney:

Renal measurements: 8.6 x 5.1 x 4.0 cm = volume: 91.5 mL. Renal
echogenicity within normal limits. No nephrolithiasis or
hydronephrosis. No focal renal mass.

Bladder:

Appears normal for degree of bladder distention. A right ureteral
jet is visualized.

Other:

None.
IMPRESSION: 1. 3 mm nonobstructive stone at the upper pole the right kidney. No
sonographic evidence for obstructive uropathy.
2. Otherwise normal renal ultrasound.

## 2021-03-27 ENCOUNTER — Emergency Department (HOSPITAL_COMMUNITY)
Admission: EM | Admit: 2021-03-27 | Discharge: 2021-03-27 | Disposition: A | Discharge status: Home / Self Care | Payer: No Typology Code available for payment source | Attending: Emergency Medicine | Admitting: Emergency Medicine

## 2021-03-27 ENCOUNTER — Other Ambulatory Visit: Payer: Self-pay

## 2021-03-27 ENCOUNTER — Emergency Department (HOSPITAL_COMMUNITY): Payer: No Typology Code available for payment source

## 2021-03-27 DIAGNOSIS — Z3491 Encounter for supervision of normal pregnancy, unspecified, first trimester: Secondary | ICD-10-CM

## 2021-03-27 DIAGNOSIS — O210 Mild hyperemesis gravidarum: Secondary | ICD-10-CM

## 2021-03-27 DIAGNOSIS — O219 Vomiting of pregnancy, unspecified: Secondary | ICD-10-CM | POA: Diagnosis present

## 2021-03-27 DIAGNOSIS — O26891 Other specified pregnancy related conditions, first trimester: Secondary | ICD-10-CM | POA: Insufficient documentation

## 2021-03-27 DIAGNOSIS — Z3490 Encounter for supervision of normal pregnancy, unspecified, unspecified trimester: Secondary | ICD-10-CM

## 2021-03-27 DIAGNOSIS — R1032 Left lower quadrant pain: Secondary | ICD-10-CM | POA: Insufficient documentation

## 2021-03-27 DIAGNOSIS — Z3A01 Less than 8 weeks gestation of pregnancy: Secondary | ICD-10-CM | POA: Insufficient documentation

## 2021-03-27 DIAGNOSIS — O2341 Unspecified infection of urinary tract in pregnancy, first trimester: Secondary | ICD-10-CM | POA: Insufficient documentation

## 2021-03-27 DIAGNOSIS — N3000 Acute cystitis without hematuria: Secondary | ICD-10-CM

## 2021-03-27 LAB — CBC
HCT: 40.2 % (ref 36.0–46.0)
Hemoglobin: 12.8 g/dL (ref 12.0–15.0)
MCH: 27.8 pg (ref 26.0–34.0)
MCHC: 31.8 g/dL (ref 30.0–36.0)
MCV: 87.2 fL (ref 80.0–100.0)
Platelets: 274 10*3/uL (ref 150–400)
RBC: 4.61 MIL/uL (ref 3.87–5.11)
RDW: 13.5 % (ref 11.5–15.5)
WBC: 8.2 10*3/uL (ref 4.0–10.5)
nRBC: 0 % (ref 0.0–0.2)

## 2021-03-27 LAB — URINALYSIS, ROUTINE W REFLEX MICROSCOPIC
Bilirubin Urine: NEGATIVE
Glucose, UA: NEGATIVE mg/dL
Hgb urine dipstick: NEGATIVE
Ketones, ur: NEGATIVE mg/dL
Nitrite: NEGATIVE
Protein, ur: NEGATIVE mg/dL
Specific Gravity, Urine: 1.021 (ref 1.005–1.030)
Squamous Epithelial / HPF: >50 — ABNORMAL HIGH (ref 0–5)
pH: 6 (ref 5.0–8.0)

## 2021-03-27 LAB — BASIC METABOLIC PANEL
Anion gap: 9 (ref 5–15)
BUN: 9 mg/dL (ref 6–20)
CO2: 22 mmol/L (ref 22–32)
Calcium: 9 mg/dL (ref 8.9–10.3)
Chloride: 104 mmol/L (ref 98–111)
Creatinine, Ser: 0.73 mg/dL (ref 0.44–1.00)
GFR, Estimated: >60 mL/min (ref >60)
Glucose, Bld: 77 mg/dL (ref 70–99)
Potassium: 3.7 mmol/L (ref 3.5–5.1)
Sodium: 135 mmol/L (ref 135–145)

## 2021-03-27 LAB — HCG, QUANTITATIVE, PREGNANCY: hCG, Beta Chain, Quant, S: 85796 m[IU]/mL — ABNORMAL HIGH (ref <5)

## 2021-03-27 MED ORDER — DOXYLAMINE-PYRIDOXINE 10-10 MG PO TBEC: 1.0000 | DELAYED_RELEASE_TABLET | Freq: Every day | ORAL | 0 refills | Status: AC

## 2021-03-27 MED ORDER — CEPHALEXIN 250 MG PO CAPS
500.0000 mg | ORAL_CAPSULE | Freq: Once | ORAL | Status: AC
  Administered 2021-03-27: 500 mg via ORAL
  Filled 2021-03-27: qty 2

## 2021-03-27 MED ORDER — ONDANSETRON 4 MG PO TBDP
8.0000 mg | ORAL_TABLET | Freq: Once | ORAL | Status: AC
  Administered 2021-03-27: 8 mg via ORAL
  Filled 2021-03-27: qty 2

## 2021-03-27 MED ORDER — CEPHALEXIN 500 MG PO CAPS: 500.0000 mg | ORAL_CAPSULE | Freq: Three times a day (TID) | ORAL | 0 refills | Status: AC

## 2021-03-27 NOTE — ED Notes (Signed)
RN reviewed discharge instructions w/ pt. Follow up and prescriptions reviewed, pt had no further questions °

## 2021-03-27 NOTE — ED Notes (Signed)
Pt in ultrasound

## 2021-03-27 NOTE — ED Provider Notes (Signed)
[facility] EMERGENCY DEPARTMENT Provider Note   CSN: [id number] Arrival date & time: 03/27/21  0857     History Chief Complaint  Patient presents with  . Abdominal Pain    Susan Lara is a 25 y.o. female.  HPI   This patient is a 25 year old female, she had her first child approximately 8 months ago, she is currently sexually active without any birth control, she reports that her last menstrual cycle was approximately 3 weeks ago, she has had about 2 weeks of nausea vomiting when eating, some left lower quadrant pain, she is had significant morning sickness with her first pregnancy, she took a home pregnancy test and 1 came back equivocal and 1 came back negative, she denies fevers or chills, denies diarrhea, denies coughing or shortness of breath, denies any other symptoms.  She has not had any vaginal bleeding.  Past Medical History:  Diagnosis Date  . Candida vaginitis 07/08/2020  . Medical history non-contributory   . Normal labor 07/10/2020    Patient Active Problem List   Diagnosis Date Noted  . Abnormal weight loss 02/11/2021  . Dysphagia 02/11/2021  . Periumbilical pain 02/11/2021  . Encounter to establish care 01/22/2021  . Pyelonephritis 01/22/2021  . Head injury 01/22/2021    Past Surgical History:  Procedure Laterality Date  . ESOPHAGEAL MANOMETRY N/A 03/07/2018   Procedure: ESOPHAGEAL MANOMETRY (EM);  Surgeon: Jeani Hawking, MD;  Location: [facility];  Service: Endoscopy;  Laterality: N/A;  . SINUS EXPLORATION  2011     OB History    Gravida  1   Para  1   Term  1   Preterm      AB      Living  1     SAB      IAB      Ectopic      Multiple  0   Live Births  1           Family History  Problem Relation Age of Onset  . Cancer Paternal Grandmother        breast  . Cancer Maternal Grandfather        brain    Social History   Tobacco Use  . Smoking status: Never Smoker  . Smokeless tobacco: Never Used   Vaping Use  . Vaping Use: Never used  Substance Use Topics  . Alcohol use: Not Currently  . Drug use: Never    Home Medications Prior to Admission medications   Medication Sig Start Date End Date Taking? Authorizing Provider  cephALEXin (KEFLEX) 500 MG capsule TAKE 1 CAPSULE BY MOUTH 4 TIMES DAILY 01/20/21 01/20/22  Namon Cirri E, PA-C  naproxen (NAPROSYN) 500 MG tablet Take 1 tablet (500 mg total) by mouth 2 (two) times daily with a meal. 02/11/21   Allena Katz, Earlie Lou, MD  naproxen (NAPROSYN) 500 MG tablet TAKE 1 TABLET BY MOUTH 2 TIMES DAILY WITH MEALS 02/11/21 02/11/22  Anabel Halon, MD    Allergies    Patient has no known allergies.  Review of Systems   Review of Systems  All other systems reviewed and are negative.   Physical Exam Updated Vital Signs BP (!) 99/55 (BP Location: Left Arm)   Pulse 74   Temp 97.9 F (36.6 C) (Oral)   Resp 16   SpO2 100%   Physical Exam Vitals and nursing note reviewed.  Constitutional:      General: She is not in acute distress.  Appearance: She is well-developed.  HENT:     Head: Normocephalic and atraumatic.     Mouth/Throat:     Pharynx: No oropharyngeal exudate.  Eyes:     General: No scleral icterus.       Right eye: No discharge.        Left eye: No discharge.     Conjunctiva/sclera: Conjunctivae normal.     Pupils: Pupils are equal, round, and reactive to light.  Neck:     Thyroid: No thyromegaly.     Vascular: No JVD.  Cardiovascular:     Rate and Rhythm: Normal rate and regular rhythm.     Heart sounds: Normal heart sounds. No murmur heard. No friction rub. No gallop.   Pulmonary:     Effort: Pulmonary effort is normal. No respiratory distress.     Breath sounds: Normal breath sounds. No wheezing or rales.  Abdominal:     General: Bowel sounds are normal. There is no distension.     Palpations: Abdomen is soft. There is no mass.     Tenderness: There is abdominal tenderness in the left lower quadrant. There  is no right CVA tenderness, left CVA tenderness, guarding or rebound. Negative signs include Murphy's sign, Rovsing's sign, McBurney's sign, psoas sign and obturator sign.  Musculoskeletal:        General: No tenderness. Normal range of motion.     Cervical back: Normal range of motion and neck supple.  Lymphadenopathy:     Cervical: No cervical adenopathy.  Skin:    General: Skin is warm and dry.     Findings: No erythema or rash.  Neurological:     Mental Status: She is alert.     Coordination: Coordination normal.  Psychiatric:        Behavior: Behavior normal.     ED Results / Procedures / Treatments   Labs (all labs ordered are listed, but only abnormal results are displayed) Labs Reviewed  URINALYSIS, ROUTINE W REFLEX MICROSCOPIC  HCG, QUANTITATIVE, PREGNANCY  CBC  BASIC METABOLIC PANEL  ABO/RH    EKG None  Radiology No results found.  Procedures Procedures   Medications Ordered in ED Medications - No data to display  ED Course  I have reviewed the triage vital signs and the nursing notes.  Pertinent labs & imaging results that were available during my care of the patient were reviewed by me and considered in my medical decision making (see chart for details).    MDM Rules/Calculators/A&P                          Well-appearing, this patient is concerned she may be pregnant and truly if she is pregnant with left lower quadrant pain there may be some element of need for an ultrasound, she has a completely benign abdomen with minimal left lower quadrant tenderness, no guarding and vital signs which are unremarkable without any tachycardia fever or difficulty breathing.  hCG quantification will be ordered, CBC and metabolic panel with an ABO Rh just in case she needs an ultrasound or RhoGAM.  Patient agreeable to the plan  The patient's care was signed out to oncoming physician, Dr. Silverio Lay to follow-up the results of the ultrasound and disposition accordingly.   The patient was hemodynamically stable throughout her stay, of note her hCG was positive indicating an early pregnancy  Final Clinical Impression(s) / ED Diagnoses Final diagnoses:  None    Rx / DC Orders  ED Discharge Orders    None       Eber Hong, MD 03/28/21 1606

## 2021-03-27 NOTE — ED Provider Notes (Signed)
  Physical Exam  BP (!) 96/58 (BP Location: Right Arm)   Pulse 66   Temp 98.2 F (36.8 C) (Oral)   Resp 18   SpO2 100%   Physical Exam  ED Course/Procedures     Procedures  MDM  Patient here with vomiting.  Patient was found to be pregnant with a hCG level [place] 536[address].  Signout pending ultrasound.  4:40 PM Ultrasound showed 6-week IUP.  Patient also has a UTI was given Keflex.  Will discharge home with keflex, OB follow up       Charlynne Pander, MD 03/27/21 [phone]

## 2021-03-27 NOTE — ED Triage Notes (Signed)
Pt presents to the ED with left sided ovary pain for a week. LMP 03/20. Pt states she is nauseous and cannot keep anything in her stomach too.

## 2021-03-27 NOTE — Discharge Instructions (Addendum)
Your testing shows that you are pregnant, but shows that you have a urinary tract infection. Your ultrasound confirmed 6 week fetus.   Please take the medication called likely just before you go to bed, 1 tablet at night.  Please also take cephalexin 1 tablet 3 times a day for 7 days to help with the urinary tract infection  You should follow-up with your OB/GYN within 7 days for a recheck, emergency department at the [facility] hospital part of [facility] for severe or worsening symptoms

## 2021-03-28 LAB — ABO/RH: ABO/RH(D): O POS

## 2021-03-30 LAB — URINE CULTURE: Culture: >=100000 — AB

## 2021-03-31 ENCOUNTER — Telehealth: Payer: Self-pay | Admitting: Emergency Medicine

## 2021-03-31 NOTE — Telephone Encounter (Signed)
Post ED Visit - Positive Culture Follow-up  Culture report reviewed by antimicrobial stewardship pharmacist: [facility] []  , Pharm.D. []  Enzo Bi, Pharm.D., BCPS AQ-ID []  , Pharm.D., BCPS []  Celedonio Miyamoto, Pharm.D., BCPS []  [place], Garvin Fila.D., BCPS, AAHIVP []  , Pharm.D., BCPS, AAHIVP []  Georgina Pillion, PharmD, BCPS []  , PharmD, BCPS []  [place], PharmD, BCPS []  [address], PharmD []  , PharmD, BCPS []  Estella Husk, PharmD  Pharmacy Team []  Lysle Pearl, PharmD []  , PharmD []  Phillips Climes, PharmD []  , Rph []  Agapito Games) , PharmD []  Verlan Friends, PharmD []  , PharmD []  Mervyn Gay, PharmD []  , PharmD []  Vinnie Level, PharmD []  [facility], PharmD []  , PharmD []  Len Childs, PharmD   Positive urine culture Treated with cephalexin, + pregnancy, attempting to call and make sure patient follows up, also needs repeat urine culture.  03/31/2021, 10:25 AM

## 2021-04-21 ENCOUNTER — Encounter: Payer: No Typology Code available for payment source | Admitting: Internal Medicine

## 2021-04-28 ENCOUNTER — Other Ambulatory Visit: Payer: Self-pay

## 2021-04-28 ENCOUNTER — Encounter (HOSPITAL_COMMUNITY): Payer: Self-pay

## 2021-04-28 ENCOUNTER — Emergency Department (HOSPITAL_COMMUNITY)
Admission: EM | Admit: 2021-04-28 | Discharge: 2021-04-29 | Disposition: A | Discharge status: Home / Self Care | Payer: No Typology Code available for payment source | Attending: Emergency Medicine | Admitting: Emergency Medicine

## 2021-04-28 DIAGNOSIS — Z5321 Procedure and treatment not carried out due to patient leaving prior to being seen by health care provider: Secondary | ICD-10-CM | POA: Insufficient documentation

## 2021-04-28 DIAGNOSIS — Z20822 Contact with and (suspected) exposure to covid-19: Secondary | ICD-10-CM | POA: Diagnosis not present

## 2021-04-28 DIAGNOSIS — R112 Nausea with vomiting, unspecified: Secondary | ICD-10-CM | POA: Insufficient documentation

## 2021-04-28 DIAGNOSIS — M791 Myalgia, unspecified site: Secondary | ICD-10-CM | POA: Insufficient documentation

## 2021-04-28 DIAGNOSIS — R519 Headache, unspecified: Secondary | ICD-10-CM | POA: Diagnosis not present

## 2021-04-28 NOTE — ED Triage Notes (Signed)
Pt reports that she has been having generalized body aches, headache, n/v for the past two days, vaccinated, not boosted

## 2021-04-29 ENCOUNTER — Other Ambulatory Visit: Payer: No Typology Code available for payment source

## 2021-04-29 LAB — SARS CORONAVIRUS 2 (TAT 6-24 HRS): SARS Coronavirus 2: NEGATIVE

## 2021-04-29 NOTE — ED Notes (Signed)
No answer for room x4 °

## 2021-06-27 ENCOUNTER — Ambulatory Visit: Payer: No Typology Code available for payment source | Attending: Internal Medicine

## 2021-06-27 DIAGNOSIS — Z20822 Contact with and (suspected) exposure to covid-19: Secondary | ICD-10-CM

## 2021-06-28 LAB — SPECIMEN STATUS REPORT

## 2021-06-28 LAB — NOVEL CORONAVIRUS, NAA: SARS-CoV-2, NAA: NOT DETECTED

## 2021-06-28 LAB — SARS-COV-2, NAA 2 DAY TAT

## 2021-07-09 ENCOUNTER — Ambulatory Visit: Payer: No Typology Code available for payment source | Admitting: Nurse Practitioner

## 2021-10-15 ENCOUNTER — Other Ambulatory Visit (HOSPITAL_BASED_OUTPATIENT_CLINIC_OR_DEPARTMENT_OTHER): Payer: Self-pay

## 2021-10-15 MED ORDER — COVID-19 AT HOME ANTIGEN TEST VI KIT
PACK | 0 refills | Status: DC
  Filled 2021-10-15: qty 2, 4d supply, fill #0

## 2022-01-12 IMAGING — US US OB COMP LESS 14 WK
1 series · 14 of 28 positions shown · non-contrast
Comparison: None.

CLINICAL DATA: Early pregnancy with pelvic pain.

EXAM:
OBSTETRIC <14 WK US AND TRANSVAGINAL OB US
TECHNIQUE: Both transabdominal and transvaginal ultrasound examinations were
performed for complete evaluation of the gestation as well as the
maternal uterus, adnexal regions, and pelvic cul-de-sac.
Transvaginal technique was performed to assess early pregnancy.

[Series 1: us ob less than 14 weeks with ob transvaginal · 79 acquisitions, 14 frames shown]
[im 3/79]
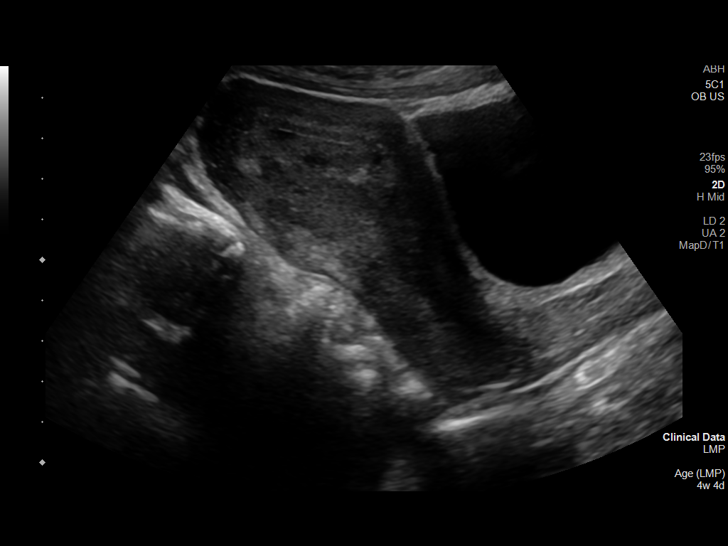
[im 9/79]
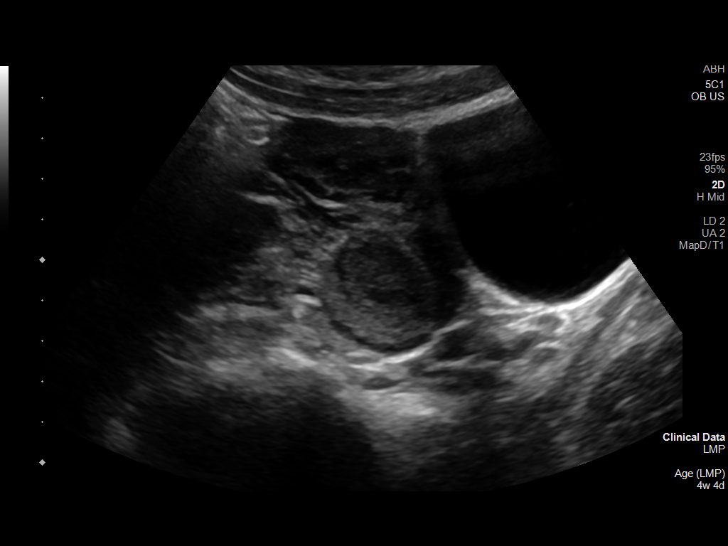
[im 15/79]
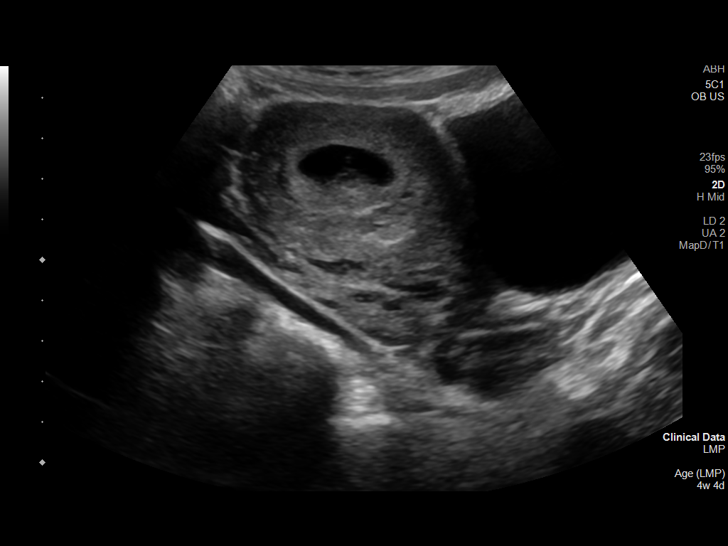
[im 21/79]
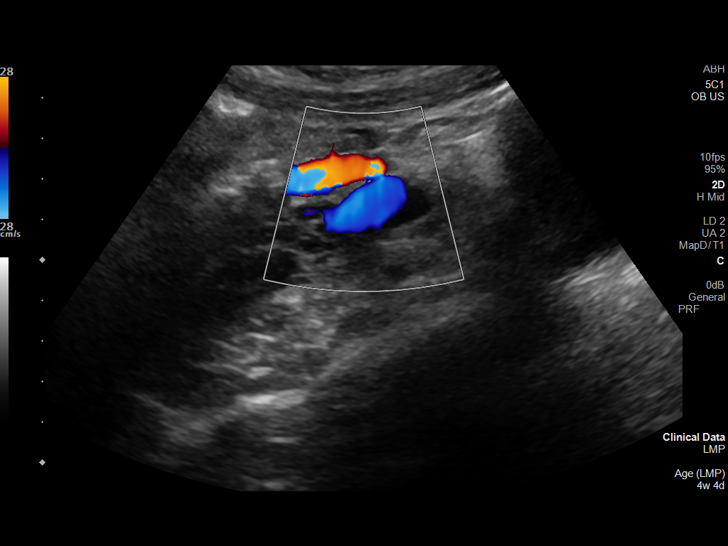
[im 27/79]
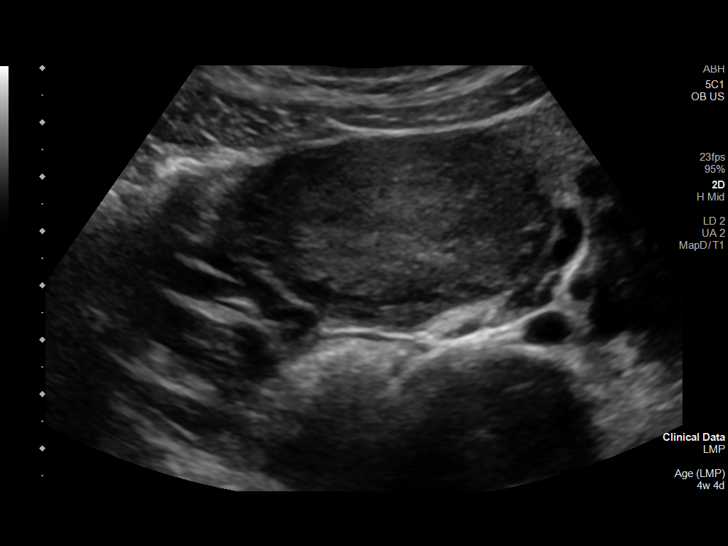
[im 32/79]
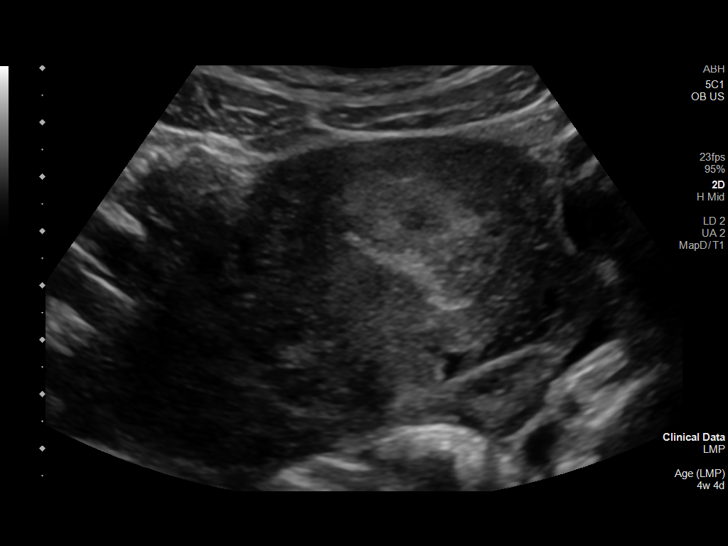
[im 38/79]
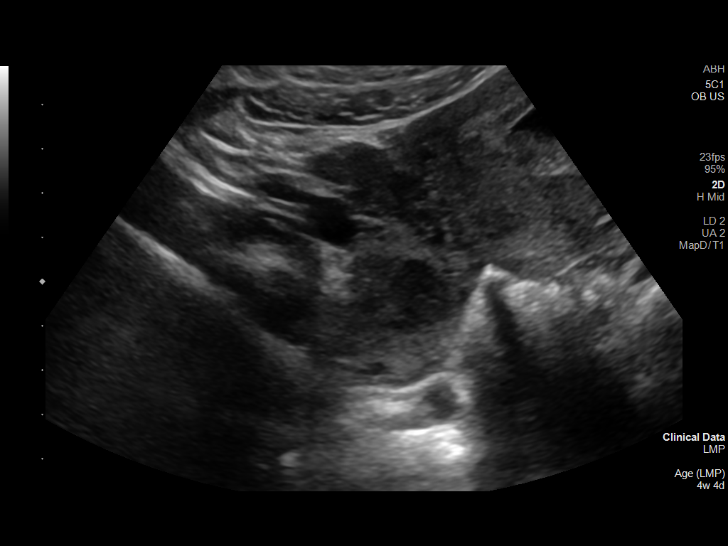
[im 44/79]
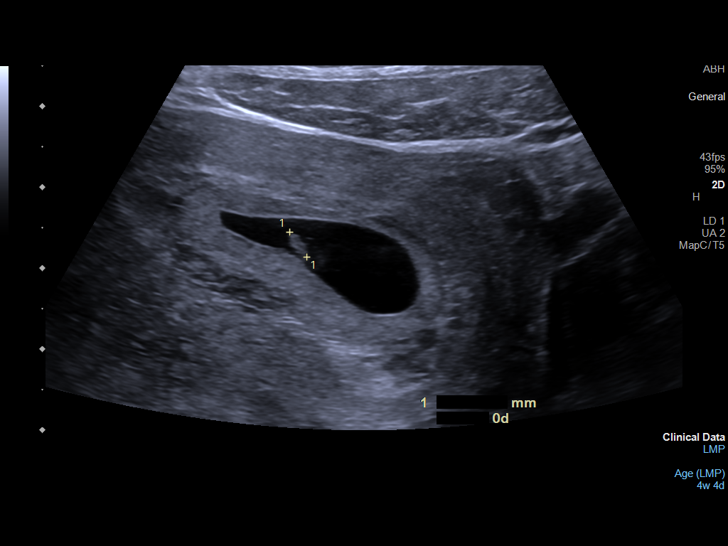
[im 50/79]
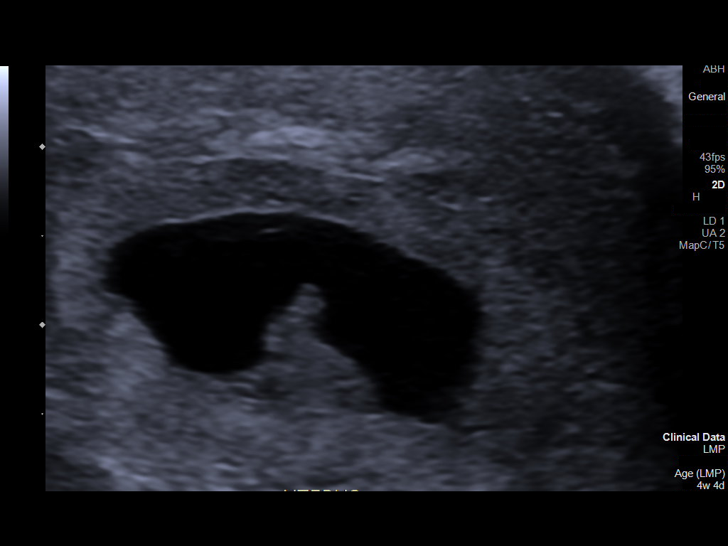
[im 55/79]
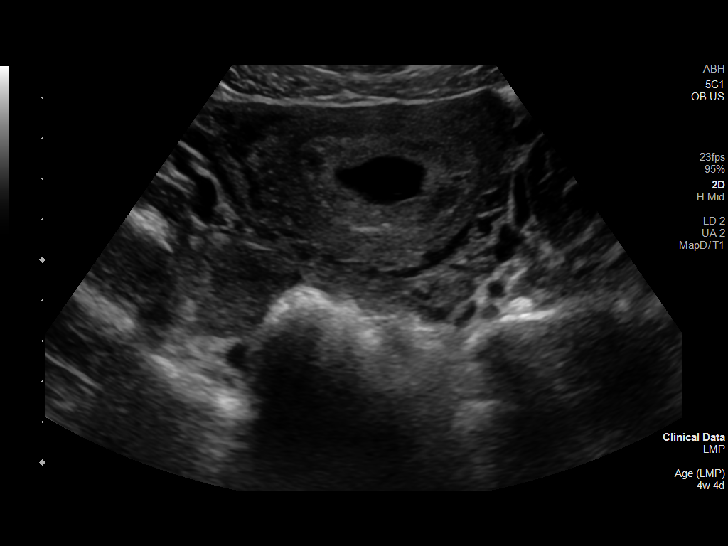
[im 61/79]
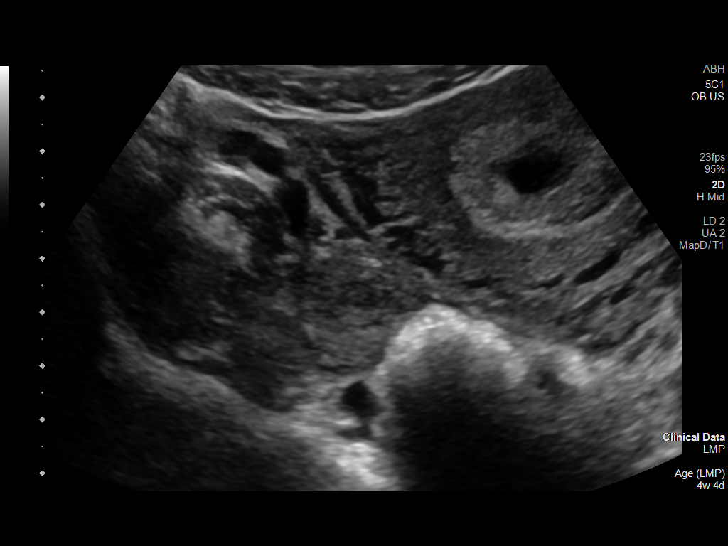
[im 67/79]
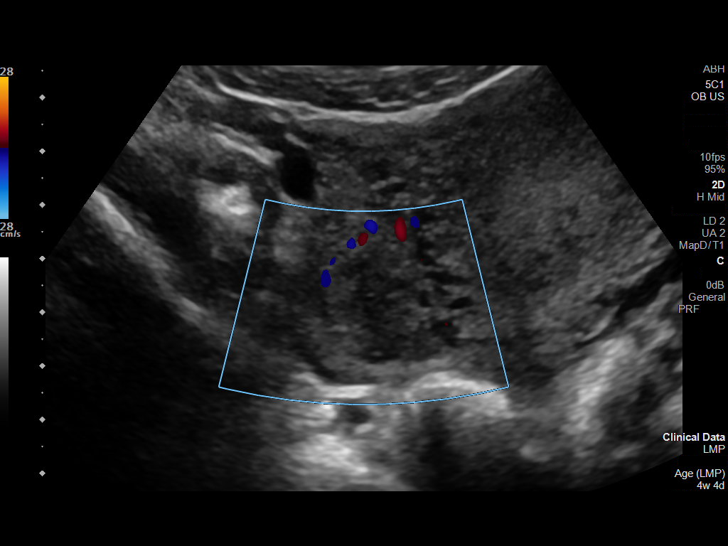
[im 73/79]
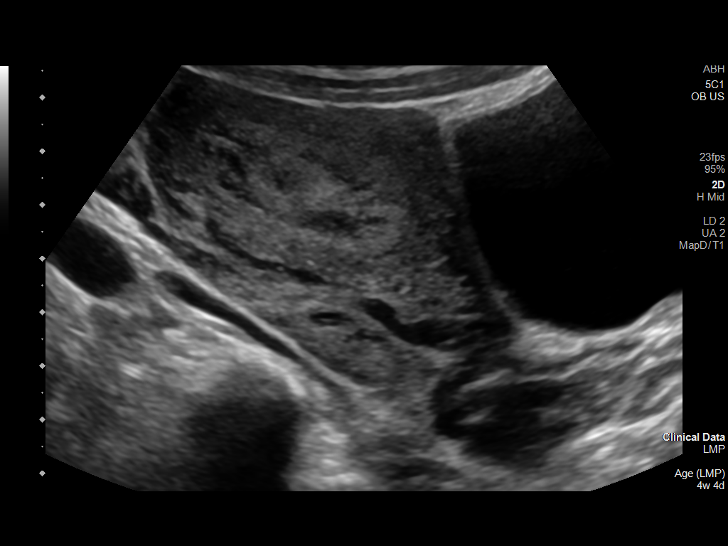
[im 79/79]
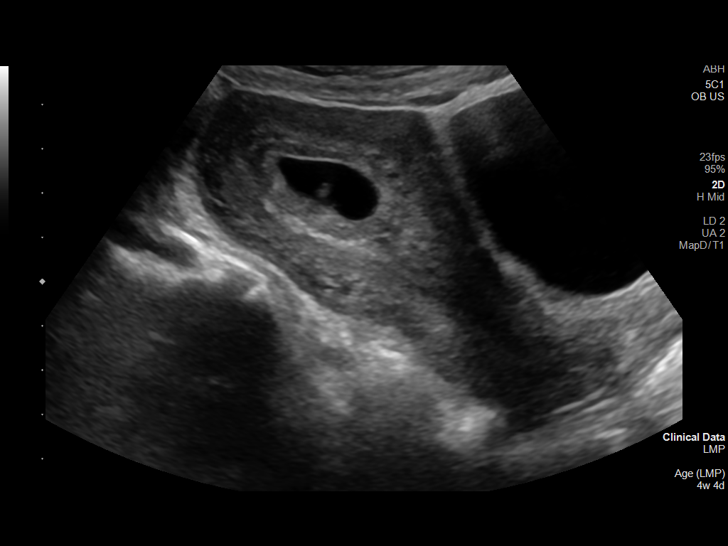

[14 of 28 positions shown; findings below may reference images not displayed]

FINDINGS: Intrauterine gestational sac: Present. Single and normal in
appearance.

Yolk sac:  Present

Embryo:  Present

Cardiac Activity: Present

Heart Rate: 144 bpm

CRL:  4.2 mm   6 w   1 d                  US EDC: 11/19/2021

Subchorionic hemorrhage:  None visualized.

Maternal uterus/adnexae: Corpus luteum evident on the right. Normal
appearance of the left ovary.
IMPRESSION: Normal appearing living single intrauterine gestation at 6 weeks 1
day by crown-rump length.

## 2022-05-20 ENCOUNTER — Emergency Department (HOSPITAL_COMMUNITY)
Admission: EM | Admit: 2022-05-20 | Discharge: 2022-05-20 | Disposition: A | Discharge status: Home / Self Care | Payer: No Typology Code available for payment source | Attending: Emergency Medicine | Admitting: Emergency Medicine

## 2022-05-20 ENCOUNTER — Encounter (HOSPITAL_COMMUNITY): Payer: Self-pay

## 2022-05-20 ENCOUNTER — Other Ambulatory Visit: Payer: Self-pay

## 2022-05-20 DIAGNOSIS — J028 Acute pharyngitis due to other specified organisms: Secondary | ICD-10-CM | POA: Insufficient documentation

## 2022-05-20 DIAGNOSIS — B9789 Other viral agents as the cause of diseases classified elsewhere: Secondary | ICD-10-CM | POA: Insufficient documentation

## 2022-05-20 DIAGNOSIS — J029 Acute pharyngitis, unspecified: Secondary | ICD-10-CM | POA: Diagnosis present

## 2022-05-20 DIAGNOSIS — M545 Low back pain, unspecified: Secondary | ICD-10-CM | POA: Diagnosis not present

## 2022-05-20 LAB — GROUP A STREP BY PCR: Group A Strep by PCR: NOT DETECTED

## 2022-05-20 LAB — POC URINE PREG, ED: Preg Test, Ur: NEGATIVE

## 2022-05-20 MED ORDER — ACETAMINOPHEN 325 MG PO TABS
650.0000 mg | ORAL_TABLET | Freq: Once | ORAL | Status: AC
  Administered 2022-05-20: 650 mg via ORAL
  Filled 2022-05-20: qty 2

## 2022-05-20 NOTE — ED Provider Notes (Signed)
[facility] EMERGENCY DEPARTMENT Provider Note   CSN: [id number] Arrival date & time: 05/20/22  0944     History  Chief Complaint  Patient presents with   Headache   Sore Throat    Susan Lara is a 26 y.o. female who presents to the emergency department for sore throat, headache, lower back pain, and left leg pain x 1 week. Has not taken anything for her symptoms. Reports hitting her left leg on her car door several days ago.   Patient also requesting pregnancy test. No associated symptoms but "just wants to be safe".    Headache Associated symptoms: back pain, congestion and sore throat   Associated symptoms: no abdominal pain, no fever, no nausea and no vomiting   Sore Throat Associated symptoms include headaches. Pertinent negatives include no abdominal pain and no shortness of breath.       Home Medications Prior to Admission medications   Not on File      Allergies    Patient has no known allergies.    Review of Systems   Review of Systems  Constitutional:  Negative for fever.  HENT:  Positive for congestion and sore throat. Negative for trouble swallowing.   Respiratory:  Negative for shortness of breath.   Gastrointestinal:  Negative for abdominal pain, nausea and vomiting.  Genitourinary:  Negative for dysuria, frequency, hematuria and urgency.  Musculoskeletal:  Positive for back pain.  Neurological:  Positive for headaches.  All other systems reviewed and are negative.   Physical Exam Updated Vital Signs BP 111/77   Pulse 79   Temp 98.1 F (36.7 C)   Resp 16   Ht 5\' 2"  (1.575 m)   Wt 49.9 kg   SpO2 99%   BMI 20.12 kg/m  Physical Exam Vitals and nursing note reviewed.  Constitutional:      Appearance: Normal appearance.  HENT:     Head: Normocephalic and atraumatic.     Mouth/Throat:     Lips: Pink.     Mouth: Mucous membranes are moist.     Pharynx: Oropharynx is clear. Posterior oropharyngeal erythema present.  No oropharyngeal exudate.     Tonsils: No tonsillar exudate.  Eyes:     Conjunctiva/sclera: Conjunctivae normal.  Cardiovascular:     Rate and Rhythm: Normal rate and regular rhythm.  Pulmonary:     Effort: Pulmonary effort is normal. No respiratory distress.     Breath sounds: Normal breath sounds.  Abdominal:     General: There is no distension.     Palpations: Abdomen is soft.     Tenderness: There is no abdominal tenderness.  Musculoskeletal:     Comments: No midline spinal tenderness, step-offs or crepitus.  Strength 5/5 in all extremities.  Sensation intact in all extremities.  Left leg without obvious deformities. Compartments soft.   Skin:    General: Skin is warm and dry.  Neurological:     General: No focal deficit present.     Mental Status: She is alert.     ED Results / Procedures / Treatments   Labs (all labs ordered are listed, but only abnormal results are displayed) Labs Reviewed  GROUP A STREP BY PCR  POC URINE PREG, ED    EKG None  Radiology No results found.  Procedures Procedures    Medications Ordered in ED Medications  acetaminophen (TYLENOL) tablet 650 mg (650 mg Oral Given 05/20/22 1412)    ED Course/ Medical Decision Making/ A&P  Medical Decision Making Risk OTC drugs.   This patient is a 26 y.o. female  who presents to the ED for concern of sore throat, headache, back pain, left leg pain. Requesting pregnancy test.    Past Medical History / Co-morbidities: No significant PMH  Physical Exam: Physical exam performed. The pertinent findings include: no midline spinal tenderness, step offs or crepitus. Posterior oropharynx with erythema, no exudate or tonsillar swelling. Normal vitals.   Lab Tests/Imaging studies: I personally interpreted labs/imaging and the pertinent results include:  negative strep and negative pregnancy.    Medications: I ordered medication including tylenol.  I have reviewed the  patients home medicines and have made adjustments as needed.   Disposition: After consideration of the diagnostic results and the patients response to treatment, I feel that patient's symptoms likely viral in origin. Will recommend symptomatic tx with over the counter medications and follow up with PCP. Discussed reasons to return to the emergency department, and the patient is agreeable to the plan.         Final Clinical Impression(s) / ED Diagnoses Final diagnoses:  Viral pharyngitis    Rx / DC Orders ED Discharge Orders     None      Portions of this report may have been transcribed using voice recognition software. Every effort was made to ensure accuracy; however, inadvertent computerized transcription errors may be present.    Jeanella Flattery 05/20/22 1459    Gloris Manchester, MD 05/20/22 1731

## 2022-05-20 NOTE — ED Provider Triage Note (Signed)
Emergency Medicine Provider Triage Evaluation Note  Susan Lara , a 26 y.o. female  was evaluated in triage.  Pt complains of sore throat, nasal congestion, headache, lower back pain, and left leg pain x 4 days. Has not taken anything for her symptoms.   Review of Systems  Positive: As above Negative: Fever, N/V/D, urinary sx  Physical Exam  BP 114/67 (BP Location: Right Arm)   Pulse 78   Temp 97.7 F (36.5 C) (Oral)   Resp 16   Ht 5\' 2"  (1.575 m)   Wt 49.9 kg   SpO2 100%   BMI 20.12 kg/m  Gen:   Awake, no distress   Resp:  Normal effort  MSK:   Moves extremities without difficulty  Other:  No midline spinal tenderness, lungs clear, oropharynx with some erythema  Medical Decision Making  Medically screening exam initiated at 10:38 AM.  Appropriate orders placed.  was informed that the remainder of the evaluation will be completed by another provider, this initial triage assessment does not replace that evaluation, and the importance of remaining in the ED until their evaluation is complete.  Will test for strep to r/o, but likely viral illness   Melvie Paglia T, PA-C 05/20/22 1039

## 2022-05-20 NOTE — Discharge Instructions (Addendum)
You were seen in the emergency department today for headache, sore throat, back pain and leg pain.  I think that you likely have a viral illness.  You tested negative for strep throat. And your pregnancy test was negative.  I recommend taking ibuprofen and Tylenol as needed for pain and fever.  You can use Chloraseptic sprays or lozenges to help with your sore throat.  You can use a heating pad or lidocaine patches for your back or your leg.

## 2022-05-20 NOTE — ED Triage Notes (Signed)
Pt arrived POV from home c/o a headache, dizziness a sore throat and lower back pain that started on Sunday.

## 2022-05-20 NOTE — ED Notes (Signed)
Patient Alert and oriented to baseline. Stable and ambulatory to baseline. Patient verbalized understanding of the discharge instructions.  Patient belongings were taken by the patient.   

## 2022-05-27 ENCOUNTER — Ambulatory Visit (INDEPENDENT_AMBULATORY_CARE_PROVIDER_SITE_OTHER): Payer: No Typology Code available for payment source | Admitting: Internal Medicine

## 2022-05-27 ENCOUNTER — Encounter: Payer: Self-pay | Admitting: Internal Medicine

## 2022-05-27 VITALS — BP 124/82 | HR 61 | Resp 18 | Ht 62.0 in | Wt 109.6 lb

## 2022-05-27 DIAGNOSIS — Z0001 Encounter for general adult medical examination with abnormal findings: Secondary | ICD-10-CM

## 2022-05-27 DIAGNOSIS — Z1159 Encounter for screening for other viral diseases: Secondary | ICD-10-CM

## 2022-05-27 NOTE — Patient Instructions (Signed)
Please get fasting blood tests done within a week.

## 2022-05-30 DIAGNOSIS — Z0001 Encounter for general adult medical examination with abnormal findings: Secondary | ICD-10-CM | POA: Insufficient documentation

## 2022-06-17 ENCOUNTER — Ambulatory Visit (INDEPENDENT_AMBULATORY_CARE_PROVIDER_SITE_OTHER): Payer: No Typology Code available for payment source | Admitting: Family Medicine

## 2022-06-17 ENCOUNTER — Encounter: Payer: Self-pay | Admitting: Family Medicine

## 2022-06-17 DIAGNOSIS — R053 Chronic cough: Secondary | ICD-10-CM

## 2022-06-17 MED ORDER — MONTELUKAST SODIUM 10 MG PO TABS: 10.0000 mg | ORAL_TABLET | Freq: Every day | ORAL | 1 refills | Status: DC

## 2022-06-17 NOTE — Progress Notes (Unsigned)
Virtual Visit via Video Note  I connected with Susan Lara on 06/17/22 at  4:40 PM EDT by a video enabled telemedicine application and verified that I am speaking with the correct person using two identifiers.  Location: Patient: home in car Provider: office   I discussed the limitations of evaluation and management by telemedicine and the availability of in person appointments. The patient expressed understanding and agreed to proceed.  History of Present Illness: 5 day h/o dry cough, uncontrollably, wont stop. Throat feels dry , tickling  and itchy feeling in throat which makes her sneeze, has had this intermittently x 1 year No fever or chills No nasal drainage, s, had sinus surgery AT AGE APPROX 14 FOR DIFFICULTY BREATHING THROUGH NOSTRIL S ister has asthma Observations/Objective:  There were no vitals taken for this visit. Good communication with no confusion and intact memory. Alert and oriented x 3 No signs of respiratory distress during speech. No coughing heard during visit  Assessment and Plan: Cough Allergic cough based on history, singulair daily and f/u with PCP   Follow Up Instructions:    I discussed the assessment and treatment plan with the patient. The patient was provided an opportunity to ask questions and all were answered. The patient agreed with the plan and demonstrated an understanding of the instructions.   The patient was advised to call back or seek an in-person evaluation if the symptoms worsen or if the condition fails to improve as anticipated.  I provided 9 minutes of non-face-to-face time during this encounter.   Syliva Overman, MD

## 2022-06-17 NOTE — Patient Instructions (Signed)
F/u WITH dR PATEL RE COUGH, AND LAB REVIEW AND GAP CLOSURE IN 6 TO 8 WEEKS, CALL IF YOU NEED TO BE SEEN SOONER  I HAVE PRESCRIBED MONTELUKAST FOR YOU COUGH WHICH I BELIEVE IS ALLERGIC BASED  Thanks for choosing [facility], we consider it a privelige to serve you.

## 2022-06-18 DIAGNOSIS — R059 Cough, unspecified: Secondary | ICD-10-CM | POA: Insufficient documentation

## 2022-06-18 NOTE — Assessment & Plan Note (Signed)
Allergic cough based on history, singulair daily and f/u with PCP

## 2022-06-29 ENCOUNTER — Ambulatory Visit: Payer: No Typology Code available for payment source

## 2022-07-09 ENCOUNTER — Telehealth: Payer: Self-pay

## 2022-07-09 DIAGNOSIS — Z0279 Encounter for issue of other medical certificate: Secondary | ICD-10-CM

## 2022-07-09 NOTE — Telephone Encounter (Signed)
Health Examination Certificate  Patient needs form back by Wednesday, 07/15/2022 AM. For work  Copied Noted sleeved

## 2022-07-14 NOTE — Telephone Encounter (Signed)
Patient will pick up forms.

## 2022-07-15 ENCOUNTER — Ambulatory Visit (INDEPENDENT_AMBULATORY_CARE_PROVIDER_SITE_OTHER): Payer: No Typology Code available for payment source

## 2022-07-15 DIAGNOSIS — Z111 Encounter for screening for respiratory tuberculosis: Secondary | ICD-10-CM | POA: Diagnosis not present

## 2022-07-15 NOTE — Telephone Encounter (Signed)
Patient picked up forms.

## 2022-07-17 LAB — TB SKIN TEST
Induration: 0 mm
TB Skin Test: NEGATIVE

## 2022-08-07 ENCOUNTER — Encounter: Payer: Self-pay | Admitting: Family Medicine

## 2022-08-07 ENCOUNTER — Other Ambulatory Visit: Payer: Self-pay

## 2022-08-07 ENCOUNTER — Ambulatory Visit (INDEPENDENT_AMBULATORY_CARE_PROVIDER_SITE_OTHER): Payer: No Typology Code available for payment source | Admitting: Family Medicine

## 2022-08-07 VITALS — BP 105/70 | HR 69 | Ht 62.0 in | Wt 112.0 lb

## 2022-08-07 DIAGNOSIS — R350 Frequency of micturition: Secondary | ICD-10-CM | POA: Diagnosis not present

## 2022-08-07 DIAGNOSIS — N3090 Cystitis, unspecified without hematuria: Secondary | ICD-10-CM | POA: Diagnosis not present

## 2022-08-07 LAB — POCT URINALYSIS DIP (CLINITEK)
Bilirubin, UA: NEGATIVE
Glucose, UA: NEGATIVE mg/dL
Nitrite, UA: NEGATIVE
POC PROTEIN,UA: 100 — AB
Spec Grav, UA: 1.025 (ref 1.010–1.025)
Urobilinogen, UA: 1 E.U./dL
pH, UA: 7 (ref 5.0–8.0)

## 2022-08-07 MED ORDER — SULFAMETHOXAZOLE-TRIMETHOPRIM 800-160 MG PO TABS: 1.0000 | ORAL_TABLET | Freq: Two times a day (BID) | ORAL | 0 refills | Status: AC

## 2022-08-07 NOTE — Assessment & Plan Note (Signed)
UA is positive for leukocytes Will treat with Bactrim for 5 days Encouraged to complete the full course of the antibiotics   You can help prevent UTIs by doing the following:  -Avoid holding urine for prolonged periods; this stretches the bladder and causes bacteria to form because bacteria like warm and wet environments to grow -Empty the bladder as soon as the need arises.  -Empty your bladder soon after intercourse.  -Take showers instead of baths -Wipe front to back; doing so after urinating and after a bowel movement helps prevent bacteria in the anal region from spreading to the vagina and urethra. -Also, drink a full glass of water to help flush bacteria.

## 2022-08-07 NOTE — Patient Instructions (Signed)
I appreciate the opportunity to provide care to you today!    Follow up:  Dr. Allena Katz   Please pick up your antibiotics at the pharmacy  Please complete the full course of the antibiotics   You can help prevent UTIs by doing the following:  -Avoid holding urine for prolonged periods; this stretches the bladder and causes bacteria to form because bacteria like warm and wet environments to grow -Empty the bladder as soon as the need arises.  -Empty your bladder soon after intercourse.  -Take showers instead of baths -Wipe front to back; doing so after urinating and after a bowel movement helps prevent bacteria in the anal region from spreading to the vagina and urethra. -Also, drink a full glass of water to help flush bacteria.      Please continue to a heart-healthy diet and increase your physical activities. Try to exercise for at least three times a week.      It was a pleasure to see you and I look forward to continuing to work together on your health and well-being. Please do not hesitate to call the office if you need care or have questions about your care.   Have a wonderful day and week. With Gratitude, Gilmore Laroche MSN, FNP-BC

## 2022-08-07 NOTE — Progress Notes (Addendum)
Acute Office Visit  Subjective:     Patient ID: Susan Lara, female    DOB: [birth year], 26 y.o.   MRN: [id number]  Chief Complaint  Patient presents with   Urinary Frequency    Since 08/07/22   Abdominal Pain    Pressure in stomach since 08/07/22   Hoarse    Since 8/28    Urinary Frequency  Associated symptoms include flank pain, frequency and urgency. Pertinent negatives include no chills or hematuria.  Abdominal Pain Associated symptoms include frequency. Pertinent negatives include no fever or hematuria.   Patient is in today for with c/o os urgency, frequency and suprapubic pain. Onset of symptoms is  08/07/22. She denies back pain, fever, hematuria, and chills.  Review of Systems  Constitutional:  Negative for chills and fever.  Gastrointestinal:  Negative for abdominal pain.  Genitourinary:  Positive for flank pain, frequency and urgency. Negative for hematuria.  Psychiatric/Behavioral:  Negative for suicidal ideas.         Objective:    BP 105/70 (BP Location: Left Arm, Patient Position: Sitting, Cuff Size: Normal)   Pulse 69   Ht 5\' 2"  (1.575 m)   Wt 112 lb (50.8 kg)   LMP 07/22/2022 (Approximate)   SpO2 98%   Breastfeeding No   BMI 20.49 kg/m    Physical Exam Abdominal:     General: Bowel sounds are normal.     Palpations: Abdomen is soft.     Tenderness: There is abdominal tenderness in the suprapubic area. There is no right CVA tenderness or left CVA tenderness.  Neurological:     Mental Status: She is alert.     Results for orders placed or performed in visit on 08/07/22  POCT URINALYSIS DIP (CLINITEK)  Result Value Ref Range   Color, UA yellow yellow   Clarity, UA clear clear   Glucose, UA negative negative mg/dL   Bilirubin, UA negative negative   Ketones, POC UA trace (5) (A) negative mg/dL   Spec Grav, UA 10/07/22 7.425 - 1.025   Blood, UA large (A) negative   pH, UA 7.0 5.0 - 8.0   POC PROTEIN,UA =100 (A) negative, trace    Urobilinogen, UA 1.0 0.2 or 1.0 E.U./dL   Nitrite, UA Negative Negative   Leukocytes, UA Large (3+) (A) Negative        Assessment & Plan:   Problem List Items Addressed This Visit       Genitourinary   Cystitis - Primary    UA is positive for leukocytes Will treat with Bactrim for 5 days Encouraged to complete the full course of the antibiotics   You can help prevent UTIs by doing the following:  -Avoid holding urine for prolonged periods; this stretches the bladder and causes bacteria to form because bacteria like warm and wet environments to grow -Empty the bladder as soon as the need arises.  -Empty your bladder soon after intercourse.  -Take showers instead of baths -Wipe front to back; doing so after urinating and after a bowel movement helps prevent bacteria in the anal region from spreading to the vagina and urethra. -Also, drink a full glass of water to help flush bacteria.       Relevant Medications   sulfamethoxazole-trimethoprim (BACTRIM DS) 800-160 MG tablet   Other Visit Diagnoses     Frequent urination       Relevant Orders   POCT URINALYSIS DIP (CLINITEK) (Completed)       Meds ordered this encounter  Medications   sulfamethoxazole-trimethoprim (BACTRIM DS) 800-160 MG tablet    Sig: Take 1 tablet by mouth 2 (two) times daily for 5 days.    Dispense:  10 tablet    Refill:  0    Return if symptoms worsen or fail to improve.  Alvira Monday, FNP

## 2022-08-12 LAB — URINE CULTURE

## 2022-08-26 ENCOUNTER — Telehealth: Payer: Self-pay | Admitting: Internal Medicine

## 2022-08-26 ENCOUNTER — Encounter: Payer: Self-pay | Admitting: Family Medicine

## 2022-08-26 ENCOUNTER — Ambulatory Visit (INDEPENDENT_AMBULATORY_CARE_PROVIDER_SITE_OTHER): Payer: No Typology Code available for payment source | Admitting: Family Medicine

## 2022-08-26 VITALS — BP 106/74 | HR 65 | Ht 62.5 in | Wt 111.0 lb

## 2022-08-26 DIAGNOSIS — R1031 Right lower quadrant pain: Secondary | ICD-10-CM

## 2022-08-26 DIAGNOSIS — R110 Nausea: Secondary | ICD-10-CM | POA: Diagnosis not present

## 2022-08-26 DIAGNOSIS — N3090 Cystitis, unspecified without hematuria: Secondary | ICD-10-CM

## 2022-08-26 DIAGNOSIS — R058 Other specified cough: Secondary | ICD-10-CM

## 2022-08-26 DIAGNOSIS — R1084 Generalized abdominal pain: Secondary | ICD-10-CM

## 2022-08-26 DIAGNOSIS — K219 Gastro-esophageal reflux disease without esophagitis: Secondary | ICD-10-CM | POA: Diagnosis not present

## 2022-08-26 DIAGNOSIS — Z23 Encounter for immunization: Secondary | ICD-10-CM

## 2022-08-26 LAB — POCT URINALYSIS DIP (CLINITEK)
Bilirubin, UA: NEGATIVE
Glucose, UA: NEGATIVE mg/dL
Ketones, POC UA: NEGATIVE mg/dL
Nitrite, UA: NEGATIVE
POC PROTEIN,UA: NEGATIVE
Spec Grav, UA: 1.025 (ref 1.010–1.025)
Urobilinogen, UA: 0.2 E.U./dL
pH, UA: 6 (ref 5.0–8.0)

## 2022-08-26 MED ORDER — PANTOPRAZOLE SODIUM 20 MG PO TBEC: 20.0000 mg | DELAYED_RELEASE_TABLET | Freq: Every day | ORAL | 0 refills | Status: DC

## 2022-08-26 MED ORDER — ONDANSETRON HCL 4 MG PO TABS: 4.0000 mg | ORAL_TABLET | Freq: Three times a day (TID) | ORAL | 0 refills | Status: DC | PRN

## 2022-08-26 MED ORDER — BENZONATATE 100 MG PO CAPS: 100.0000 mg | ORAL_CAPSULE | Freq: Two times a day (BID) | ORAL | 0 refills | Status: DC | PRN

## 2022-08-26 MED ORDER — NITROFURANTOIN MONOHYD MACRO 100 MG PO CAPS: 100.0000 mg | ORAL_CAPSULE | Freq: Two times a day (BID) | ORAL | 0 refills | Status: AC

## 2022-08-26 NOTE — Assessment & Plan Note (Addendum)
Low suspicion of appendicitis given negative rovsing sign Given the patient's dietary intake, will start a trial of Protonix  Encouraged to take Tylenol as needed for body aches and  pain Advised to Avoid certain foods and drinks, such as coffee, chocolate, onions, peppermint, spicy foods, carbonated beverages, citrus fruits, tomatoes, onions, Garlic, alcohol, Fatty foods (bacon, burgers, sausages, steak, fried foods, dairy food) Recommended: High fiber foods, whole grain cereal, oatmeal, brown rice, root vegetables, non- citrus fruits, High protein foods, Health fats (avocados, olive oil, nuts and seeds)

## 2022-08-26 NOTE — Assessment & Plan Note (Addendum)
Noninfectious cough Will start a trail of Tessalon pearls

## 2022-08-26 NOTE — Telephone Encounter (Signed)
Note up front for pick up

## 2022-08-26 NOTE — Progress Notes (Signed)
Acute Office Visit  Subjective:     Patient ID: Susan Lara, female    DOB: 11-02-1996, 26 y.o.   MRN: [id number]  Chief Complaint  Patient presents with   Cough    Pt c/o dry cough since last week. Also c/o nausea feels like she cant keep anything down.   Generalized Body Aches    Pt c/o body cramping and aching all over, unsure what it is coming from.    Cough Associated symptoms include heartburn. Pertinent negatives include no chills or fever.   The patient is in today for RLQ pain, which started on 08/25/22. She rates pain 10/10. The pain is described as sharp and intensifies with the intake of foods, carbonated beverages, and citrusy juice. Pain occurs 2-5 minutes after ingestion and in the RLQ. She reports nausea occasionally and denies vomiting, fever, and chills.  Cough: intermittent non-productive cough with c/o body aches and cramping.      Review of Systems  Constitutional:  Negative for chills, fever and malaise/fatigue.  Respiratory:  Positive for cough.   Gastrointestinal:  Positive for abdominal pain, heartburn and nausea. Negative for diarrhea and vomiting.  Genitourinary:  Negative for hematuria.  Musculoskeletal:  Negative for back pain and falls.        Objective:    BP 106/74   Pulse 65   Ht 5' 2.5" (1.588 m)   Wt 111 lb (50.3 kg)   LMP 07/22/2022 (Approximate)   SpO2 99%   BMI 19.98 kg/m    Physical Exam HENT:     Head: Normocephalic.     Right Ear: External ear normal.     Left Ear: External ear normal.  Cardiovascular:     Rate and Rhythm: Normal rate and regular rhythm.     Pulses: Normal pulses.     Heart sounds: Normal heart sounds.  Pulmonary:     Effort: Pulmonary effort is normal.     Breath sounds: Normal breath sounds.  Abdominal:     Tenderness: There is abdominal tenderness in the right lower quadrant and suprapubic area. There is no guarding or rebound. Negative signs include Rovsing's sign.  Neurological:      Mental Status: She is alert.     Results for orders placed or performed in visit on 08/26/22  POCT URINALYSIS DIP (CLINITEK)  Result Value Ref Range   Color, UA yellow yellow   Clarity, UA clear clear   Glucose, UA negative negative mg/dL   Bilirubin, UA negative negative   Ketones, POC UA negative negative mg/dL   Spec Grav, UA 1.025 1.010 - 1.025   Blood, UA small (A) negative   pH, UA 6.0 5.0 - 8.0   POC PROTEIN,UA negative negative, trace   Urobilinogen, UA 0.2 0.2 or 1.0 E.U./dL   Nitrite, UA Negative Negative   Leukocytes, UA Small (1+) (A) Negative        Assessment & Plan:   Problem List Items Addressed This Visit       Genitourinary   Cystitis    UA shows small leukocytes The patient was treated with bactrim on 08/07/22 for cystitis Will treat her today with Macrobid and send urine for a culture      Relevant Medications   nitrofurantoin, macrocrystal-monohydrate, (MACROBID) 100 MG capsule   Other Relevant Orders   Urine Culture     Other   Cough    Noninfectious cough Will start a trail of Tessalon pearls  Relevant Medications   benzonatate (TESSALON) 100 MG capsule   RLQ abdominal pain    Low suspicion of appendicitis given negative rovsing sign Given the patient's dietary intake, will start a trial of Protonix  Encouraged to take Tylenol as needed for body aches and  pain Advised to Avoid certain foods and drinks, such as coffee, chocolate, onions, peppermint, spicy foods, carbonated beverages, citrus fruits, tomatoes, onions, Garlic, alcohol, Fatty foods (bacon, burgers, sausages, steak, fried foods, dairy food) Recommended: High fiber foods, whole grain cereal, oatmeal, brown rice, root vegetables, non- citrus fruits, High protein foods, Health fats (avocados, olive oil, nuts and seeds)        Nausea    Zofran ordered for nausea      Relevant Medications   ondansetron (ZOFRAN) 4 MG tablet   Other Visit Diagnoses     Gastroesophageal  reflux disease without esophagitis    -  Primary   Relevant Medications   pantoprazole (PROTONIX) 20 MG tablet   ondansetron (ZOFRAN) 4 MG tablet   Immunization due       Relevant Orders   Tdap vaccine greater than or equal to 7yo IM (Completed)   HPV 9-valent vaccine,Recombinat (Completed)   Generalized abdominal pain       Relevant Orders   POCT URINALYSIS DIP (CLINITEK) (Completed)       Meds ordered this encounter  Medications   pantoprazole (PROTONIX) 20 MG tablet    Sig: Take 1 tablet (20 mg total) by mouth daily.    Dispense:  30 tablet    Refill:  0   benzonatate (TESSALON) 100 MG capsule    Sig: Take 1 capsule (100 mg total) by mouth 2 (two) times daily as needed for cough.    Dispense:  20 capsule    Refill:  0   ondansetron (ZOFRAN) 4 MG tablet    Sig: Take 1 tablet (4 mg total) by mouth every 8 (eight) hours as needed for nausea or vomiting.    Dispense:  15 tablet    Refill:  0   nitrofurantoin, macrocrystal-monohydrate, (MACROBID) 100 MG capsule    Sig: Take 1 capsule (100 mg total) by mouth 2 (two) times daily for 5 days.    Dispense:  10 capsule    Refill:  0    Return if symptoms worsen or fail to improve.  Alvira Monday, FNP

## 2022-08-26 NOTE — Patient Instructions (Addendum)
I appreciate the opportunity to provide care to you today!    Follow up: Dr. Posey Pronto  -Pick up your prescriptions at the pharmacy  Lifestyle changes for GERD:  Avoid certain foods and drinks, such as coffee, chocolate, onions, peppermint, spicy foods, carbonated beverages, citrus fruits, tomatoes, onions, Garlic, alcohol, Fatty foods (bacon, burgers, sausages, steak, fried foods, dairy food)    Recommended: High fiber foods, whole grain cereal, oatmeal, brown rice, root vegetables, non- citrus fruits, High protein foods, Health fats (avocados, olive oil, nuts and seeds)     Please continue to a heart-healthy diet and increase your physical activities. Try to exercise for 54mins at least three times a week.      It was a pleasure to see you and I look forward to continuing to work together on your health and well-being. Please do not hesitate to call the office if you need care or have questions about your care.   Have a wonderful day and week. With Gratitude, Alvira Monday MSN, FNP-BC

## 2022-08-26 NOTE — Assessment & Plan Note (Signed)
Zofran ordered for nausea

## 2022-08-26 NOTE — Assessment & Plan Note (Signed)
UA shows small leukocytes The patient was treated with bactrim on 08/07/22 for cystitis Will treat her today with Macrobid and send urine for a culture

## 2022-08-26 NOTE — Telephone Encounter (Signed)
Patient called in regard to work note   Needs return to work dated for Sept 22.  Patient is on the way to office to pick up pw for mother wants to get note as well.

## 2022-08-29 LAB — URINE CULTURE

## 2022-09-16 ENCOUNTER — Ambulatory Visit: Payer: No Typology Code available for payment source | Admitting: Internal Medicine

## 2022-09-16 ENCOUNTER — Encounter: Payer: Self-pay | Admitting: Internal Medicine

## 2022-09-17 ENCOUNTER — Other Ambulatory Visit: Payer: Self-pay | Admitting: Family Medicine

## 2022-09-17 DIAGNOSIS — K219 Gastro-esophageal reflux disease without esophagitis: Secondary | ICD-10-CM

## 2022-09-19 ENCOUNTER — Other Ambulatory Visit: Payer: Self-pay

## 2022-09-19 ENCOUNTER — Encounter (HOSPITAL_COMMUNITY): Payer: Self-pay | Admitting: *Deleted

## 2022-09-19 ENCOUNTER — Emergency Department (HOSPITAL_COMMUNITY)
Admission: EM | Admit: 2022-09-19 | Discharge: 2022-09-19 | Disposition: A | Discharge status: Home / Self Care | Payer: No Typology Code available for payment source | Attending: Emergency Medicine | Admitting: Emergency Medicine

## 2022-09-19 ENCOUNTER — Emergency Department (HOSPITAL_COMMUNITY): Payer: No Typology Code available for payment source

## 2022-09-19 DIAGNOSIS — K529 Noninfective gastroenteritis and colitis, unspecified: Secondary | ICD-10-CM | POA: Diagnosis not present

## 2022-09-19 DIAGNOSIS — Z20822 Contact with and (suspected) exposure to covid-19: Secondary | ICD-10-CM | POA: Insufficient documentation

## 2022-09-19 DIAGNOSIS — J189 Pneumonia, unspecified organism: Secondary | ICD-10-CM | POA: Diagnosis not present

## 2022-09-19 DIAGNOSIS — M791 Myalgia, unspecified site: Secondary | ICD-10-CM | POA: Diagnosis present

## 2022-09-19 LAB — URINALYSIS, ROUTINE W REFLEX MICROSCOPIC
Bilirubin Urine: NEGATIVE
Glucose, UA: NEGATIVE mg/dL
Hgb urine dipstick: NEGATIVE
Ketones, ur: 20 mg/dL — AB
Nitrite: NEGATIVE
Protein, ur: NEGATIVE mg/dL
Specific Gravity, Urine: 1.024 (ref 1.005–1.030)
pH: 5 (ref 5.0–8.0)

## 2022-09-19 LAB — CBC WITH DIFFERENTIAL/PLATELET
Abs Immature Granulocytes: 0.04 10*3/uL (ref 0.00–0.07)
Basophils Absolute: 0 10*3/uL (ref 0.0–0.1)
Basophils Relative: 0 %
Eosinophils Absolute: 0 10*3/uL (ref 0.0–0.5)
Eosinophils Relative: 0 %
HCT: 44.2 % (ref 36.0–46.0)
Hemoglobin: 14.6 g/dL (ref 12.0–15.0)
Immature Granulocytes: 0 %
Lymphocytes Relative: 4 %
Lymphs Abs: 0.5 10*3/uL — ABNORMAL LOW (ref 0.7–4.0)
MCH: 28.4 pg (ref 26.0–34.0)
MCHC: 33 g/dL (ref 30.0–36.0)
MCV: 86 fL (ref 80.0–100.0)
Monocytes Absolute: 0.4 10*3/uL (ref 0.1–1.0)
Monocytes Relative: 3 %
Neutro Abs: 12.5 10*3/uL — ABNORMAL HIGH (ref 1.7–7.7)
Neutrophils Relative %: 93 %
Platelets: 234 10*3/uL (ref 150–400)
RBC: 5.14 MIL/uL — ABNORMAL HIGH (ref 3.87–5.11)
RDW: 12.8 % (ref 11.5–15.5)
WBC: 13.4 10*3/uL — ABNORMAL HIGH (ref 4.0–10.5)
nRBC: 0 % (ref 0.0–0.2)

## 2022-09-19 LAB — LIPASE, BLOOD: Lipase: 27 U/L (ref 11–51)

## 2022-09-19 LAB — COMPREHENSIVE METABOLIC PANEL
ALT: 11 U/L (ref 0–44)
AST: 18 U/L (ref 15–41)
Albumin: 4.4 g/dL (ref 3.5–5.0)
Alkaline Phosphatase: 78 U/L (ref 38–126)
Anion gap: 8 (ref 5–15)
BUN: 11 mg/dL (ref 6–20)
CO2: 23 mmol/L (ref 22–32)
Calcium: 8.9 mg/dL (ref 8.9–10.3)
Chloride: 105 mmol/L (ref 98–111)
Creatinine, Ser: 0.7 mg/dL (ref 0.44–1.00)
GFR, Estimated: >60 mL/min (ref >60)
Glucose, Bld: 91 mg/dL (ref 70–99)
Potassium: 3.8 mmol/L (ref 3.5–5.1)
Sodium: 136 mmol/L (ref 135–145)
Total Bilirubin: 1.7 mg/dL — ABNORMAL HIGH (ref 0.3–1.2)
Total Protein: 7.8 g/dL (ref 6.5–8.1)

## 2022-09-19 LAB — PREGNANCY, URINE: Preg Test, Ur: NEGATIVE

## 2022-09-19 LAB — SARS CORONAVIRUS 2 BY RT PCR: SARS Coronavirus 2 by RT PCR: NEGATIVE

## 2022-09-19 MED ORDER — AMOXICILLIN-POT CLAVULANATE 875-125 MG PO TABS: 1.0000 | ORAL_TABLET | Freq: Two times a day (BID) | ORAL | 0 refills | Status: AC

## 2022-09-19 MED ORDER — ONDANSETRON 4 MG PO TBDP: 4.0000 mg | ORAL_TABLET | Freq: Three times a day (TID) | ORAL | 0 refills | Status: DC | PRN

## 2022-09-19 MED ORDER — IOHEXOL 300 MG/ML  SOLN
100.0000 mL | Freq: Once | INTRAMUSCULAR | Status: AC | PRN
  Administered 2022-09-19: 100 mL via INTRAVENOUS

## 2022-09-19 MED ORDER — ONDANSETRON 4 MG PO TBDP
4.0000 mg | ORAL_TABLET | Freq: Once | ORAL | Status: AC
  Administered 2022-09-19: 4 mg via ORAL
  Filled 2022-09-19: qty 1

## 2022-09-19 MED ORDER — IBUPROFEN 600 MG PO TABS: 600.0000 mg | ORAL_TABLET | Freq: Four times a day (QID) | ORAL | 0 refills | Status: DC | PRN

## 2022-09-19 MED ORDER — AMOXICILLIN-POT CLAVULANATE 875-125 MG PO TABS
1.0000 | ORAL_TABLET | Freq: Once | ORAL | Status: AC
  Administered 2022-09-19: 1 via ORAL
  Filled 2022-09-19: qty 1

## 2022-09-19 MED ORDER — IBUPROFEN 400 MG PO TABS
600.0000 mg | ORAL_TABLET | Freq: Once | ORAL | Status: AC
  Administered 2022-09-19: 600 mg via ORAL
  Filled 2022-09-19: qty 2

## 2022-09-19 NOTE — Discharge Instructions (Addendum)
Your medical work-up in the ER included blood test, urine sample, and CT scan of the abdomen, and x-ray of the chest.  Your work-up showed signs of an infection.  There is a possibility of a pneumonia on the left side of your lung seen on the x-ray.  We also see signs of inflammation in your bowels which is called enteritis.  We decided to start you on antibiotic called Augmentin.  You will take this twice a day for 7 days.  Augmentin can cause nausea, upset stomach and diarrhea.  Consider taking this with food or meals.  I also prescribed Zofran which is a nausea medicine that may help with nausea.  You can continue taking ibuprofen at home for muscle aches and fevers.  Please complete the full course of antibiotics even if you are feeling better.  You should try to schedule follow-up appointment with your doctors office this week if possible.  *  You should return to the emergency room if you have worsening vomiting, cannot keep down any fluids, bloody diarrhea, confusion, lethargy, or loss of consciousness, or any other emergency concerns.  *  Your COVID test was negative in the ER today.

## 2022-09-19 NOTE — ED Triage Notes (Signed)
Pt with left shoulder pain suddenly since last Sunday, denies any injury to shoulder.  Pt also with c/o of her entire back hurting since last night.

## 2022-09-19 NOTE — ED Provider Notes (Signed)
Susan Lara   CSN: [id number] Arrival date & time: 09/19/22  1108     History  Chief Complaint  Patient presents with   Shoulder Pain    Susan Lara is a 26 y.o. female presenting to the ED with myalgias, nausea vomiting.  She reports her symptoms began with sharp pain in her left upper shoulder 6 days ago on Sunday.  Over the past few days she has developed myalgias and aching pains involving her lower back, arms and legs.  She said she was having nausea and vomiting last night and feels nauseous this morning.  She reports a headache this morning.  She denies sore throat, cough, congestion.  She denies sick contacts in the house.  She denies any other medical problems, or any history of abdominal surgery.  She denies dysuria, hematuria, vaginal discharge or bleeding, and she is not on her menstrual cycle.  She is here with her fianc at the bedside.  She denies any known falls, traumas, or injuries to her left shoulder  HPI     Home Medications Prior to Admission medications   Medication Sig Start Date End Date Taking? Authorizing Provider  amoxicillin-clavulanate (AUGMENTIN) 875-125 MG tablet Take 1 tablet by mouth 2 (two) times daily for 7 days. 09/19/22 09/26/22 Yes Toby Ayad, Carola Rhine, MD  ibuprofen (ADVIL) 600 MG tablet Take 1 tablet (600 mg total) by mouth every 6 (six) hours as needed for up to 30 doses for moderate pain, mild pain or headache (Muscle aches). 09/19/22  Yes Wyvonnia Dusky, MD  ondansetron (ZOFRAN-ODT) 4 MG disintegrating tablet Take 1 tablet (4 mg total) by mouth every 8 (eight) hours as needed for up to 15 doses for nausea or vomiting. 09/19/22  Yes Daton Szilagyi, Carola Rhine, MD  benzonatate (TESSALON) 100 MG capsule Take 1 capsule (100 mg total) by mouth 2 (two) times daily as needed for cough. 08/26/22   Alvira Monday, FNP  montelukast (SINGULAIR) 10 MG tablet Take 1 tablet (10 mg total) by mouth at bedtime. Patient not  taking: Reported on 08/07/2022 06/17/22   Fayrene Helper, MD  ondansetron (ZOFRAN) 4 MG tablet Take 1 tablet (4 mg total) by mouth every 8 (eight) hours as needed for nausea or vomiting. 08/26/22   Alvira Monday, FNP  pantoprazole (PROTONIX) 20 MG tablet TAKE 1 TABLET BY MOUTH EVERY DAY 09/17/22   Lindell Spar, MD      Allergies    Patient has no known allergies.    Review of Systems   Review of Systems  Physical Exam Updated Vital Signs BP (!) 110/51 (BP Location: Right Arm)   Pulse (!) 107   Temp 98 F (36.7 C) (Oral)   Resp 16   Ht 5' 2.5" (1.588 m)   Wt 51.3 kg   LMP 09/09/2022   SpO2 98%   BMI 20.34 kg/m  Physical Exam Constitutional:      General: She is not in acute distress. HENT:     Head: Normocephalic and atraumatic.  Eyes:     Conjunctiva/sclera: Conjunctivae normal.     Pupils: Pupils are equal, round, and reactive to light.  Cardiovascular:     Rate and Rhythm: Normal rate and regular rhythm.  Pulmonary:     Effort: Pulmonary effort is normal. No respiratory distress.  Abdominal:     General: There is no distension.     Tenderness: There is abdominal tenderness in the left lower quadrant. There is no guarding.  Negative signs include Murphy's sign and McBurney's sign.  Musculoskeletal:     Comments: Tenderness of the left upper humerus, full range of motion of all the joints and extremities.  No joint swelling or warmth  Skin:    General: Skin is warm and dry.  Neurological:     General: No focal deficit present.     Mental Status: She is alert. Mental status is at baseline.  Psychiatric:        Mood and Affect: Mood normal.        Behavior: Behavior normal.     ED Results / Procedures / Treatments   Labs (all labs ordered are listed, but only abnormal results are displayed) Labs Reviewed  COMPREHENSIVE METABOLIC PANEL - Abnormal; Notable for the following components:      Result Value   Total Bilirubin 1.7 (*)    All other components  within normal limits  CBC WITH DIFFERENTIAL/PLATELET - Abnormal; Notable for the following components:   WBC 13.4 (*)    RBC 5.14 (*)    Neutro Abs 12.5 (*)    Lymphs Abs 0.5 (*)    All other components within normal limits  URINALYSIS, ROUTINE W REFLEX MICROSCOPIC - Abnormal; Notable for the following components:   APPearance HAZY (*)    Ketones, ur 20 (*)    Leukocytes,Ua SMALL (*)    Bacteria, UA FEW (*)    All other components within normal limits  SARS CORONAVIRUS 2 BY RT PCR  LIPASE, BLOOD  PREGNANCY, URINE    EKG None  Radiology DG Chest 2 View  Result Date: 09/19/2022 CLINICAL DATA:  Left shoulder pain with SIRS criteria. Evaluate left-sided pneumonia. EXAM: CHEST - 2 VIEW COMPARISON:  None Available. FINDINGS: The heart size and mediastinal contours are within normal limits. Left basilar opacity suggesting atelectasis or infiltrate. The visualized skeletal structures are unremarkable. IMPRESSION: Left basilar opacity suggesting atelectasis or infiltrate. Electronically Signed   By: Larose Hires D.O.   On: 09/19/2022 13:13   CT ABDOMEN PELVIS W CONTRAST  Result Date: 09/19/2022 CLINICAL DATA:  Vomiting, left lower quadrant abdomen pain. EXAM: CT ABDOMEN AND PELVIS WITH CONTRAST TECHNIQUE: Multidetector CT imaging of the abdomen and pelvis was performed using the standard protocol following bolus administration of intravenous contrast. RADIATION DOSE REDUCTION: This exam was performed according to the departmental dose-optimization program which includes automated exposure control, adjustment of the mA and/or kV according to patient size and/or use of iterative reconstruction technique. CONTRAST:  OMNIPAQUE IOHEXOL 300 MG/ML  SOLN COMPARISON:  None Available. FINDINGS: Lower chest: No acute abnormality. Hepatobiliary: A few tiny liver cysts are noted. The gallbladder is normal. The biliary tree is normal. Pancreas: Unremarkable. No pancreatic ductal dilatation or  surrounding inflammatory changes. Spleen: Normal in size without focal abnormality. Adrenals/Urinary Tract: The bilateral adrenal glands are normal. 1.1 cm simple cyst versus prominent calyx is identified in the midpole right kidney. No follow-up is recommended. There is no hydronephrosis bilaterally. The bladder is normal. Stomach/Bowel: The stomach is normal. The appendix is normal. There are air-filled mild prominent small bowel loops in the left upper and mid abdomen. There are thickened wall small bowel loops in the left mid and lower abdomen. Vascular/Lymphatic: No significant vascular findings are present. No enlarged abdominal or pelvic lymph nodes. Reproductive: Uterus and bilateral adnexa are unremarkable. Other: Minimal umbilical herniation of mesenteric fat is identified. Musculoskeletal: No acute abnormality. IMPRESSION: Air-filled mild prominent small bowel loops in the left upper and mid  abdomen. There are thickened wall small bowel loops in the left mid and lower abdomen. The findings are nonspecific but can be seen in enteritis. The left kidney is normal without hydronephrosis. Electronically Signed   By: Sherian Rein M.D.   On: 09/19/2022 13:12    Procedures Procedures    Medications Ordered in ED Medications  ibuprofen (ADVIL) tablet 600 mg (600 mg Oral Given 09/19/22 1144)  ondansetron (ZOFRAN-ODT) disintegrating tablet 4 mg (4 mg Oral Given 09/19/22 1144)  iohexol (OMNIPAQUE) 300 MG/ML solution 100 mL (100 mLs Intravenous Contrast Given 09/19/22 1256)  amoxicillin-clavulanate (AUGMENTIN) 875-125 MG per tablet 1 tablet (1 tablet Oral Given 09/19/22 1330)    ED Course/ Medical Decision Making/ A&P Clinical Course as of 09/19/22 1531  Sat Sep 19, 2022  1230 With the patient's tachycardia and leukocytosis and abdominal tenderness, I discussed with her advanced imaging, including CT imaging of the abdomen, to evaluate for potential colitis versus pyelonephritis versus ureteral  colic, or another cause of her abdominal pain and vomiting.  She may be experiencing referred pain to her left shoulder.  Also discussed an x-ray to ensure there is no left sided pneumonia, as there is some pleuritic component to her symptoms [MT]  1240 SARS Coronavirus 2 by RT PCR: NEGATIVE [MT]  1325 CT and x-ray reviewed with the patient and her family.  Given her tachycardia and leukocytosis I think is reasonable to treat for bacterial infection at this time, will prescribe 7 days of Augmentin to cross cover for both enteritis and possible left lower lobe pneumonia.  We will also prescribe Zofran for home.  She verbalized understanding and agreement.  Hospitalization was considered but at this point ultimately she is clinically well-appearing enough for management at home. [MT]    Clinical Course User Index [MT] Azion Centrella, Kermit Balo, MD                           Medical Decision Making Amount and/or Complexity of Data Reviewed Labs: ordered. Decision-making details documented in ED Course. Radiology: ordered.  Risk Prescription drug management.   This patient presents to the ED with concern for myalgias, nausea vomiting, headache. This involves an extensive number of treatment options, and is a complaint that carries with it a high risk of complications and morbidity.  The differential diagnosis includes viral syndrome most likely, versus electrolyte derangement, versus intra-abdominal process, versus other  She has no rash or recalls any tick exposure to raise concern for Lyme disease.  I reviewed her external records he does have a history of UTIs, most recently treated in April 2022, with Enterobacter resistant to Macrobid and cefazolin.  I ordered and personally interpreted labs.  The pertinent results include: Leukocytosis, no clear evidence of UTI.  I ordered imaging studies including x-ray of the chest, CT of the abdomen I independently visualized and interpreted imaging which  showed potential left lower sided infiltrate, enteritis of the bowels. I agree with the radiologist interpretation  I ordered medication including Motrin, Zofran for myalgias and nausea.  Augmentin for left lower lobe infection and possible enteritis/colitis  I have reviewed the patients home medicines and have made adjustments as needed  After the interventions noted above, I reevaluated the patient and found that they have: improved   Dispostion:  After consideration of the diagnostic results and the patients response to treatment, I feel that the patent would benefit from outpatient PCP follow-up.  Final Clinical Impression(s) / ED Diagnoses Final diagnoses:  Pneumonia of left lower lobe due to infectious organism  Enteritis    Rx / DC Orders ED Discharge Orders          Ordered    amoxicillin-clavulanate (AUGMENTIN) 875-125 MG tablet  2 times daily        09/19/22 1329    ondansetron (ZOFRAN-ODT) 4 MG disintegrating tablet  Every 8 hours PRN        09/19/22 1329    ibuprofen (ADVIL) 600 MG tablet  Every 6 hours PRN        09/19/22 1329              Terald Sleeper, MD 09/19/22 [phone]

## 2022-09-23 ENCOUNTER — Ambulatory Visit (INDEPENDENT_AMBULATORY_CARE_PROVIDER_SITE_OTHER): Payer: No Typology Code available for payment source | Admitting: Internal Medicine

## 2022-09-23 ENCOUNTER — Encounter: Payer: Self-pay | Admitting: Internal Medicine

## 2022-09-23 DIAGNOSIS — J189 Pneumonia, unspecified organism: Secondary | ICD-10-CM

## 2022-09-23 DIAGNOSIS — K529 Noninfective gastroenteritis and colitis, unspecified: Secondary | ICD-10-CM

## 2022-09-23 NOTE — Patient Instructions (Signed)
Please complete course of Augmentin.  Please maintain adequate hydration by taking at least 64 ounces of fluid in a day.  Okay to take ibuprofen as needed for back pain.

## 2022-09-23 NOTE — Progress Notes (Signed)
Virtual Visit via Telephone Note   This visit type was conducted via telephone. This format is felt to be most appropriate for this patient at this time.  The patient did not have access to video technology/had technical difficulties with video requiring transitioning to audio format only (telephone).  All issues noted in this document were discussed and addressed.  No physical exam could be performed with this format.  Evaluation Performed:  Follow-up visit  Date:  09/23/2022   ID:  Susan Lara, DOB March 23, 1996, MRN [id number]  Patient Location: Home Provider Location: Office/Clinic  Participants: Patient Location of Patient: Home Location of Provider: Telehealth Consent was obtain for visit to be over via telehealth. I verified that I am speaking with the correct person using two identifiers.  PCP:  Lindell Spar, MD   Chief Complaint: ER visit follow-up  History of Present Illness:    Susan Lara is a 26 y.o. female who has a televisit for follow-up follow-up of recent ER visit.  She had myalgias, lower back pain and left upper shoulder pain.  She had chest x-ray, which showed possible left lower lobe pneumonia.  She also had CT abdomen, which showed enteritis.  She was given Augmentin.  She currently denies any fever, chills, vomiting, dyspnea or wheezing.  She has had loose BM with Augmentin.  Her back pain and flank pain have been improving now.  The patient does not have symptoms concerning for COVID-19 infection (fever, chills, cough, or new shortness of breath).   Past Medical, Surgical, Social History, Allergies, and Medications have been Reviewed.  Past Medical History:  Diagnosis Date   Candida vaginitis 07/08/2020   Medical history non-contributory    Normal labor 07/10/2020   Past Surgical History:  Procedure Laterality Date   ESOPHAGEAL MANOMETRY N/A 03/07/2018   Procedure: ESOPHAGEAL MANOMETRY (EM);  Surgeon: Carol Ada, MD;  Location: [facility]  ENDOSCOPY;  Service: Endoscopy;  Laterality: N/A;   SINUS EXPLORATION  2011     Current Meds  Medication Sig   amoxicillin-clavulanate (AUGMENTIN) 875-125 MG tablet Take 1 tablet by mouth 2 (two) times daily for 7 days.   benzonatate (TESSALON) 100 MG capsule Take 1 capsule (100 mg total) by mouth 2 (two) times daily as needed for cough.   ibuprofen (ADVIL) 600 MG tablet Take 1 tablet (600 mg total) by mouth every 6 (six) hours as needed for up to 30 doses for moderate pain, mild pain or headache (Muscle aches).   ondansetron (ZOFRAN) 4 MG tablet Take 1 tablet (4 mg total) by mouth every 8 (eight) hours as needed for nausea or vomiting.   ondansetron (ZOFRAN-ODT) 4 MG disintegrating tablet Take 1 tablet (4 mg total) by mouth every 8 (eight) hours as needed for up to 15 doses for nausea or vomiting.     Allergies:   Patient has no known allergies.   ROS:   Please see the history of present illness.     All other systems reviewed and are negative.   Labs/Other Tests and Data Reviewed:    Recent Labs: 09/19/2022: ALT 11; BUN 11; Creatinine, Ser 0.70; Hemoglobin 14.6; Platelets 234; Potassium 3.8; Sodium 136   Recent Lipid Panel No results found for: "CHOL", "TRIG", "HDL", "CHOLHDL", "LDLCALC", "LDLDIRECT"  Wt Readings from Last 3 Encounters:  09/19/22 113 lb (51.3 kg)  08/26/22 111 lb (50.3 kg)  08/07/22 112 lb (50.8 kg)     ASSESSMENT & PLAN:    ER visit follow-up  Left lower lobe pneumonia Enteritis ER chart reviewed, including blood tests and imaging On Augmentin Advised to maintain adequate hydration Advised to perform deep breathing exercises to avoid atelectasis Zofran as needed for nausea Work note provided  Time:   Today, I have spent 12 minutes reviewing the chart, including problem list, medications, and with the patient with telehealth technology discussing the above problems.   Medication Adjustments/Labs and Tests Ordered: Current medicines are reviewed at  length with the patient today.  Concerns regarding medicines are outlined above.   Tests Ordered: No orders of the defined types were placed in this encounter.   Medication Changes: No orders of the defined types were placed in this encounter.    Note: This dictation was prepared with Dragon dictation along with smaller phrase technology. Similar sounding words can be transcribed inadequately or may not be corrected upon review. Any transcriptional errors that result from this process are unintentional.      Disposition:  Follow up  Signed, Lindell Spar, MD  09/23/2022 12:09 PM     [place]

## 2022-09-30 ENCOUNTER — Encounter: Payer: Self-pay | Admitting: Internal Medicine

## 2022-09-30 ENCOUNTER — Ambulatory Visit (INDEPENDENT_AMBULATORY_CARE_PROVIDER_SITE_OTHER): Payer: No Typology Code available for payment source | Admitting: Internal Medicine

## 2022-09-30 DIAGNOSIS — J189 Pneumonia, unspecified organism: Secondary | ICD-10-CM | POA: Diagnosis not present

## 2022-09-30 DIAGNOSIS — R052 Subacute cough: Secondary | ICD-10-CM

## 2022-09-30 MED ORDER — METHYLPREDNISOLONE 4 MG PO TBPK: ORAL_TABLET | ORAL | 0 refills | Status: DC

## 2022-09-30 NOTE — Progress Notes (Signed)
Virtual Visit via Telephone Note   This visit type was conducted via telephone. This format is felt to be most appropriate for this patient at this time.  The patient did not have access to video technology/had technical difficulties with video requiring transitioning to audio format only (telephone).  All issues noted in this document were discussed and addressed.  No physical exam could be performed with this format.  Evaluation Performed:  Follow-up visit  Date:  09/30/2022   ID:  Susan Lara, [place]abia 15-Jun-1996, MRN [id number]  Patient Location: Home Provider Location: Office/Clinic  Participants: Patient Location of Patient: Home Location of Provider: Telehealth Consent was obtain for visit to be over via telehealth. I verified that I am speaking with the correct person using two identifiers.  PCP:  Lindell Spar, MD   Chief Complaint: Cough and left-sided chest pain  History of Present Illness:    Susan Lara is a 26 y.o. female who has a televisit for complaint of cough and left-sided chest pain for the last 2 weeks.  She was recently seen in urgent care for CAP and was prescribed azithromycin for it.  She has completed antibiotic and has taken Tessalon as needed for cough.  She still has dry cough and left-sided chest pain.  Denies any dyspnea or wheezing currently.  Denies any fever or chills.  The patient does not have symptoms concerning for COVID-19 infection (fever, chills, cough, or new shortness of breath).   Past Medical, Surgical, Social History, Allergies, and Medications have been Reviewed.  Past Medical History:  Diagnosis Date   Candida vaginitis 07/08/2020   Medical history non-contributory    Normal labor 07/10/2020   Past Surgical History:  Procedure Laterality Date   ESOPHAGEAL MANOMETRY N/A 03/07/2018   Procedure: ESOPHAGEAL MANOMETRY (EM);  Surgeon: Carol Ada, MD;  Location: [facility] ENDOSCOPY;  Service: Endoscopy;  Laterality: N/A;   SINUS  EXPLORATION  2011     Current Meds  Medication Sig   ibuprofen (ADVIL) 600 MG tablet Take 1 tablet (600 mg total) by mouth every 6 (six) hours as needed for up to 30 doses for moderate pain, mild pain or headache (Muscle aches).   ondansetron (ZOFRAN-ODT) 4 MG disintegrating tablet Take 1 tablet (4 mg total) by mouth every 8 (eight) hours as needed for up to 15 doses for nausea or vomiting.     Allergies:   Patient has no known allergies.   ROS:   Please see the history of present illness.     All other systems reviewed and are negative.   Labs/Other Tests and Data Reviewed:    Recent Labs: 09/19/2022: ALT 11; BUN 11; Creatinine, Ser 0.70; Hemoglobin 14.6; Platelets 234; Potassium 3.8; Sodium 136   Recent Lipid Panel No results found for: "CHOL", "TRIG", "HDL", "CHOLHDL", "LDLCALC", "LDLDIRECT"  Wt Readings from Last 3 Encounters:  09/19/22 113 lb (51.3 kg)  08/26/22 111 lb (50.3 kg)  08/07/22 112 lb (50.8 kg)     ASSESSMENT & PLAN:    CAP Subacute cough/postinfectious cough Cough likely from postinfectious/inflammatory phase Medrol Dosepak Robitussin-DM max as needed for cough Check chest x-ray after 1 month  Time:   Today, I have spent 9 minutes reviewing the chart, including problem list, medications, and with the patient with telehealth technology discussing the above problems.   Medication Adjustments/Labs and Tests Ordered: Current medicines are reviewed at length with the patient today.  Concerns regarding medicines are outlined above.   Tests  Ordered: No orders of the defined types were placed in this encounter.   Medication Changes: No orders of the defined types were placed in this encounter.    Note: This dictation was prepared with Dragon dictation along with smaller phrase technology. Similar sounding words can be transcribed inadequately or may not be corrected upon review. Any transcriptional errors that result from this process are  unintentional.      Disposition:  Follow up  Signed, Anabel Halon, MD  09/30/2022 4:41 PM     [facility]

## 2022-09-30 NOTE — Patient Instructions (Signed)
Please take prednisone as prescribed.  Please take Robitussin DM max as needed for cough.  Please get chest done at [facility] after 1 month.

## 2022-10-01 ENCOUNTER — Encounter: Payer: Self-pay | Admitting: *Deleted

## 2022-10-01 ENCOUNTER — Telehealth: Payer: Self-pay | Admitting: Internal Medicine

## 2022-10-01 NOTE — Telephone Encounter (Signed)
Please let her know may return 10/29 and work note is available via Pharmacist, community or she can come pick this up

## 2022-10-01 NOTE — Telephone Encounter (Signed)
Patient informed. 

## 2022-10-01 NOTE — Telephone Encounter (Signed)
Patient called in regard to tele visit on 10/25  Patient is requesting work note.  Patient also wants a call back in regard for return to work date.

## 2023-02-01 ENCOUNTER — Telehealth: Payer: 59 | Admitting: Family Medicine

## 2023-02-01 ENCOUNTER — Encounter: Payer: Self-pay | Admitting: Family Medicine

## 2023-02-02 ENCOUNTER — Encounter: Payer: Self-pay | Admitting: Internal Medicine

## 2023-02-02 ENCOUNTER — Telehealth (INDEPENDENT_AMBULATORY_CARE_PROVIDER_SITE_OTHER): Payer: 59 | Admitting: Internal Medicine

## 2023-02-02 DIAGNOSIS — Z20822 Contact with and (suspected) exposure to covid-19: Secondary | ICD-10-CM | POA: Insufficient documentation

## 2023-02-02 DIAGNOSIS — R109 Unspecified abdominal pain: Secondary | ICD-10-CM | POA: Insufficient documentation

## 2023-02-02 DIAGNOSIS — R1032 Left lower quadrant pain: Secondary | ICD-10-CM

## 2023-02-02 MED ORDER — DICYCLOMINE HCL 20 MG PO TABS: 20.0000 mg | ORAL_TABLET | Freq: Four times a day (QID) | ORAL | 0 refills | Status: DC | PRN

## 2023-02-02 MED ORDER — IBUPROFEN 600 MG PO TABS: 600.0000 mg | ORAL_TABLET | Freq: Four times a day (QID) | ORAL | 0 refills | Status: DC | PRN

## 2023-02-02 NOTE — Progress Notes (Signed)
Virtual Visit via Video Note  I connected with Hartley Barefoot on 02/02/23 at 11:40 AM EST by a video enabled telemedicine application and verified that I am speaking with the correct person using two identifiers.  Patient Location: Home Provider Location: Office/Clinic  I discussed the limitations, risks, security, and privacy concerns of performing an evaluation and management service by video and the availability of in person appointments. I also discussed with the patient that there may be a patient responsible charge related to this service. The patient expressed understanding and agreed to proceed.  Subjective: PCP: Lindell Spar, MD  Chief Complaint  Patient presents with   Covid Exposure    Exposed on 01/31/23. Cough, runny nose, headache, pains on left side of stomach.   Ms. Jarzabek has been evaluated today for an acute video visit for symptoms of nonproductive cough, nasal congestion, headache, sore throat, low-grade fever, and left-sided abdominal pain.  She reports being exposed to someone with COVID-19 on Sunday (2/25).  Later that day she developed the symptoms noted above.  She endorses left-sided abdominal pain but denies nausea/vomiting and diarrhea.  She has been able to tolerate liquids but has not felt like eating solids over the last 2 days.  Her Tmax is 101F.  She has not been taking any medications specifically for symptom relief.  Ms. Lyke received 2 Moderna COVID-19 vaccines in 2021.  She has not received any booster vaccinations since that time.  She did not receive her influenza vaccine this year.  ROS: Per HPI  Current Outpatient Medications:    dicyclomine (BENTYL) 20 MG tablet, Take 1 tablet (20 mg total) by mouth every 6 (six) hours as needed for spasms., Disp: 30 tablet, Rfl: 0   methylPREDNISolone (MEDROL DOSEPAK) 4 MG TBPK tablet, Take as package instructions., Disp: 1 each, Rfl: 0   ondansetron (ZOFRAN-ODT) 4 MG disintegrating tablet, Take 1  tablet (4 mg total) by mouth every 8 (eight) hours as needed for up to 15 doses for nausea or vomiting., Disp: 15 tablet, Rfl: 0   ibuprofen (ADVIL) 600 MG tablet, Take 1 tablet (600 mg total) by mouth every 6 (six) hours as needed for up to 30 doses for moderate pain, mild pain or headache (Muscle aches)., Disp: 30 tablet, Rfl: 0  Assessment and Plan: Left lower quadrant abdominal pain Assessment & Plan: She endorses left-sided abdominal pain that began 2 days ago at the onset of her additional symptoms.  She denies nausea/vomiting as well as diarrhea.  She has been able to tolerate liquids but has not felt like eating solid food.  Pain has not worsened but also has not improved.  No prior history of abdominal issues. -Bentyl prescribed for as needed symptom relief.  Gradual increase in p.o. intake encouraged. -She was instructed to return to care later this week if her symptoms have not improved.  Would consider CT AP at that time.  Close exposure to COVID-19 virus Assessment & Plan: Evaluated today for the symptoms endorsed above.  She was exposed to someone with COVID-19 on Sunday (2/25) and developed the symptoms noted above later the same day. -COVID-19/influenza testing ordered today -I have recommended supportive care measures for now, including adequate hydration, regular use of Tylenol, and over-the-counter cough/cold medications for symptom relief -She was instructed to maintain isolation status until testing results  Follow Up Instructions: Return if symptoms worsen or fail to improve.   I discussed the assessment and treatment plan with the patient. The patient  was provided an opportunity to ask questions, and all were answered. The patient agreed with the plan and demonstrated an understanding of the instructions.   The patient was advised to call back or seek an in-person evaluation if the symptoms worsen or if the condition fails to improve as anticipated.  The above  assessment and management plan was discussed with the patient. The patient verbalized understanding of and has agreed to the management plan.   Johnette Abraham, MD

## 2023-02-02 NOTE — Assessment & Plan Note (Signed)
Evaluated today for the symptoms endorsed above.  She was exposed to someone with COVID-19 on Sunday (2/25) and developed the symptoms noted above later the same day. -COVID-19/influenza testing ordered today -I have recommended supportive care measures for now, including adequate hydration, regular use of Tylenol, and over-the-counter cough/cold medications for symptom relief -She was instructed to maintain isolation status until testing results

## 2023-02-02 NOTE — Assessment & Plan Note (Addendum)
She endorses left-sided abdominal pain that began 2 days ago at the onset of her additional symptoms.  She denies nausea/vomiting as well as diarrhea.  She has been able to tolerate liquids but has not felt like eating solid food.  Pain has not worsened but also has not improved.  No prior history of abdominal issues. -Bentyl prescribed for as needed symptom relief.  Gradual increase in p.o. intake encouraged. -She was instructed to return to care later this week if her symptoms have not improved.  Would consider CT AP at that time.

## 2023-02-17 NOTE — Progress Notes (Signed)
NO SHOW APPOINTMENT

## 2023-04-07 DIAGNOSIS — Z419 Encounter for procedure for purposes other than remedying health state, unspecified: Secondary | ICD-10-CM | POA: Diagnosis not present

## 2023-04-26 ENCOUNTER — Telehealth: Payer: Self-pay | Admitting: Internal Medicine

## 2023-04-26 NOTE — Telephone Encounter (Signed)
Need this by end of today

## 2023-04-26 NOTE — Telephone Encounter (Signed)
Patient called need a copy of TB test from 08.09.2023 to pick up. Call patient when ready. [phone]

## 2023-04-26 NOTE — Telephone Encounter (Signed)
Note ready for pick up. Pt aware. °

## 2023-04-29 ENCOUNTER — Other Ambulatory Visit: Payer: Self-pay

## 2023-04-29 DIAGNOSIS — Z118 Encounter for screening for other infectious and parasitic diseases: Secondary | ICD-10-CM

## 2023-05-08 DIAGNOSIS — Z419 Encounter for procedure for purposes other than remedying health state, unspecified: Secondary | ICD-10-CM | POA: Diagnosis not present

## 2023-05-31 ENCOUNTER — Encounter: Payer: No Typology Code available for payment source | Admitting: Internal Medicine

## 2023-06-03 ENCOUNTER — Encounter: Payer: Medicaid Other | Admitting: Internal Medicine

## 2023-06-07 DIAGNOSIS — Z419 Encounter for procedure for purposes other than remedying health state, unspecified: Secondary | ICD-10-CM | POA: Diagnosis not present

## 2023-07-08 DIAGNOSIS — Z419 Encounter for procedure for purposes other than remedying health state, unspecified: Secondary | ICD-10-CM | POA: Diagnosis not present

## 2023-07-12 ENCOUNTER — Encounter: Payer: Medicaid Other | Admitting: Internal Medicine

## 2023-07-19 ENCOUNTER — Other Ambulatory Visit: Payer: Self-pay

## 2023-07-19 ENCOUNTER — Encounter (HOSPITAL_COMMUNITY): Payer: Self-pay

## 2023-07-19 ENCOUNTER — Emergency Department (HOSPITAL_COMMUNITY)
Admission: EM | Admit: 2023-07-19 | Discharge: 2023-07-19 | Disposition: A | Discharge status: Home / Self Care | Payer: 59 | Attending: Emergency Medicine | Admitting: Emergency Medicine

## 2023-07-19 DIAGNOSIS — U071 COVID-19: Secondary | ICD-10-CM | POA: Diagnosis not present

## 2023-07-19 DIAGNOSIS — R112 Nausea with vomiting, unspecified: Secondary | ICD-10-CM | POA: Diagnosis not present

## 2023-07-19 DIAGNOSIS — R110 Nausea: Secondary | ICD-10-CM | POA: Diagnosis present

## 2023-07-19 LAB — COMPREHENSIVE METABOLIC PANEL
ALT: 12 U/L (ref 0–44)
AST: 17 U/L (ref 15–41)
Albumin: 4.4 g/dL (ref 3.5–5.0)
Alkaline Phosphatase: 69 U/L (ref 38–126)
Anion gap: 11 (ref 5–15)
BUN: 13 mg/dL (ref 6–20)
CO2: 24 mmol/L (ref 22–32)
Calcium: 9.1 mg/dL (ref 8.9–10.3)
Chloride: 99 mmol/L (ref 98–111)
Creatinine, Ser: 0.76 mg/dL (ref 0.44–1.00)
GFR, Estimated: >60 mL/min (ref >60)
Glucose, Bld: 85 mg/dL (ref 70–99)
Potassium: 3.4 mmol/L — ABNORMAL LOW (ref 3.5–5.1)
Sodium: 134 mmol/L — ABNORMAL LOW (ref 135–145)
Total Bilirubin: 1.1 mg/dL (ref 0.3–1.2)
Total Protein: 7.8 g/dL (ref 6.5–8.1)

## 2023-07-19 LAB — CBC
HCT: 44.4 % (ref 36.0–46.0)
Hemoglobin: 14.5 g/dL (ref 12.0–15.0)
MCH: 28.2 pg (ref 26.0–34.0)
MCHC: 32.7 g/dL (ref 30.0–36.0)
MCV: 86.2 fL (ref 80.0–100.0)
Platelets: 203 10*3/uL (ref 150–400)
RBC: 5.15 MIL/uL — ABNORMAL HIGH (ref 3.87–5.11)
RDW: 13.2 % (ref 11.5–15.5)
WBC: 5.6 10*3/uL (ref 4.0–10.5)
nRBC: 0 % (ref 0.0–0.2)

## 2023-07-19 LAB — RESP PANEL BY RT-PCR (RSV, FLU A&B, COVID)  RVPGX2
Influenza A by PCR: NEGATIVE
Influenza B by PCR: NEGATIVE
Resp Syncytial Virus by PCR: NEGATIVE
SARS Coronavirus 2 by RT PCR: POSITIVE — AB

## 2023-07-19 LAB — LIPASE, BLOOD: Lipase: 19 U/L (ref 11–51)

## 2023-07-19 MED ORDER — ONDANSETRON 4 MG PO TBDP: 4.0000 mg | ORAL_TABLET | Freq: Three times a day (TID) | ORAL | 0 refills | Status: DC | PRN

## 2023-07-19 NOTE — ED Provider Notes (Signed)
[place] EMERGENCY DEPARTMENT AT [facility] Provider Note   CSN: [id number] Arrival date & time: 07/19/23  1503     History  Chief Complaint  Patient presents with   Chills   Abdominal Pain    Susan Lara is a 27 y.o. female presents today for evaluation of flulike symptoms.  She reports that she has had chills, runny nose, nausea and abdominal pain since Saturday.  States the symptoms have progressively gotten worse.  States she has been around someone who has similar symptoms.  Denies chest pain, shortness of breath, bowel changes, urinary symptoms, abnormal vaginal discharge, blood in her stool or urine.   Abdominal Pain     Past Medical History:  Diagnosis Date   Candida vaginitis 07/08/2020   Medical history non-contributory    Normal labor 07/10/2020   Past Surgical History:  Procedure Laterality Date   ESOPHAGEAL MANOMETRY N/A 03/07/2018   Procedure: ESOPHAGEAL MANOMETRY (EM);  Surgeon: Jeani Hawking, MD;  Location: [facility];  Service: Endoscopy;  Laterality: N/A;   SINUS EXPLORATION  2011     Home Medications Prior to Admission medications   Medication Sig Start Date End Date Taking? Authorizing Provider  dicyclomine (BENTYL) 20 MG tablet Take 1 tablet (20 mg total) by mouth every 6 (six) hours as needed for spasms. 02/02/23   Billie Lade, MD  ibuprofen (ADVIL) 600 MG tablet Take 1 tablet (600 mg total) by mouth every 6 (six) hours as needed for up to 30 doses for moderate pain, mild pain or headache (Muscle aches). 02/02/23   Billie Lade, MD  methylPREDNISolone (MEDROL DOSEPAK) 4 MG TBPK tablet Take as package instructions. 09/30/22   Anabel Halon, MD  ondansetron (ZOFRAN-ODT) 4 MG disintegrating tablet Take 1 tablet (4 mg total) by mouth every 8 (eight) hours as needed for up to 15 doses for nausea or vomiting. 09/19/22   Terald Sleeper, MD      Allergies    Patient has no known allergies.    Review of Systems   Review of  Systems  Gastrointestinal:  Positive for abdominal pain.    Physical Exam Updated Vital Signs BP 111/67 (BP Location: Right Arm)   Pulse 98   Temp 98.6 F (37 C) (Oral)   Resp 16   Ht 5\' 2"  (1.575 m)   Wt 51.3 kg   LMP 07/05/2023   SpO2 100%   BMI 20.67 kg/m  Physical Exam Vitals and nursing note reviewed.  Constitutional:      Appearance: Normal appearance.  HENT:     Head: Normocephalic and atraumatic.     Mouth/Throat:     Mouth: Mucous membranes are moist.  Eyes:     General: No scleral icterus. Cardiovascular:     Rate and Rhythm: Normal rate and regular rhythm.     Pulses: Normal pulses.     Heart sounds: Normal heart sounds.  Pulmonary:     Effort: Pulmonary effort is normal.     Breath sounds: Normal breath sounds.  Abdominal:     General: Abdomen is flat.     Palpations: Abdomen is soft.     Tenderness: There is no abdominal tenderness.  Musculoskeletal:        General: No deformity.  Skin:    General: Skin is warm.     Findings: No rash.  Neurological:     General: No focal deficit present.     Mental Status: She is alert.  Psychiatric:  Mood and Affect: Mood normal.     ED Results / Procedures / Treatments   Labs (all labs ordered are listed, but only abnormal results are displayed) Labs Reviewed  RESP PANEL BY RT-PCR (RSV, FLU A&B, COVID)  RVPGX2 - Abnormal; Notable for the following components:      Result Value   SARS Coronavirus 2 by RT PCR POSITIVE (*)    All other components within normal limits  COMPREHENSIVE METABOLIC PANEL - Abnormal; Notable for the following components:   Sodium 134 (*)    Potassium 3.4 (*)    All other components within normal limits  CBC - Abnormal; Notable for the following components:   RBC 5.15 (*)    All other components within normal limits  LIPASE, BLOOD  URINALYSIS, ROUTINE W REFLEX MICROSCOPIC  POC URINE PREG, ED    EKG None  Radiology No results found.  Procedures Procedures     Medications Ordered in ED Medications - No data to display  ED Course/ Medical Decision Making/ A&P                                 Medical Decision Making Amount and/or Complexity of Data Reviewed Labs: ordered.   This patient presents to the ED for sore throat, body aches, this involves an extensive number of treatment options, and is a complaint that carries with a high risk of complications and morbidity.  The differential diagnosis includes flu, COVID, RSV, strep, pharyngitis, bronchitis, pneumonia, infectious etiology.  This is not an exhaustive list.  Lab tests: I ordered and personally interpreted labs.  The pertinent results include: Viral panel positive for COVID.  Imaging studies:  Problem list/ ED course/ Critical interventions/ Medical management: HPI: See above Vital signs within normal range and stable throughout visit. Laboratory/imaging studies significant for: See above. On physical examination, patient is afebrile and appears in no acute distress. This patient presents with symptoms suspicious for COVID. Based on history and physical doubt sinusitis. COVID test was positive. Do not suspect underlying cardiopulmonary process. I considered, but think unlikely, pneumonia. Patient is nontoxic appearing and not in need of emergent medical intervention. Patient told to self isolate at home until symptoms subside for 72 hours. Recommended patient to take TheraFlu or Mucinex for symptom relief.  Follow-up with primary care physician for further evaluation and management.  Return to the ER if new or worsening symptoms. I have reviewed the patient home medicines and have made adjustments as needed.  Cardiac monitoring/EKG: The patient was maintained on a cardiac monitor.  I personally reviewed and interpreted the cardiac monitor which showed an underlying rhythm of: sinus rhythm.  Additional history obtained: External records from outside source obtained and reviewed  including: Chart review including previous notes, labs, imaging.  Consultations obtained:  Disposition Continued outpatient therapy. Follow-up with PCP recommended for reevaluation of symptoms. Treatment plan discussed with patient.  Pt acknowledged understanding was agreeable to the plan. Worrisome signs and symptoms were discussed with patient, and patient acknowledged understanding to return to the ED if they noticed these signs and symptoms. Patient was stable upon discharge.   This chart was dictated using voice recognition software.  Despite best efforts to proofread,  errors can occur which can change the documentation meaning.          Final Clinical Impression(s) / ED Diagnoses Final diagnoses:  COVID  Nausea    Rx / DC Orders ED  Discharge Orders          Ordered    ondansetron (ZOFRAN-ODT) 4 MG disintegrating tablet  Every 8 hours PRN        07/19/23 1842              Jeanelle Malling, PA 07/19/23 [place]    Cathren Laine, MD 07/20/23 [id number]

## 2023-07-19 NOTE — ED Triage Notes (Signed)
Pt arrives ambulatory to triage with c/o chills and abdominal pain with runny nose and nausea since Saturday states that symptoms have gotten worse, tried to work today and felt dizzy and came to ED. LMP 2 weeks ago. States that that she was around someone with similar symptoms.

## 2023-07-19 NOTE — Discharge Instructions (Signed)
You tested positive for COVID today.  Please rest, stay hydrated.  You can also try over-the-counter DayQuil/NyQuil, TheraFlu or Mucinex for symptom relief. Take tylenol/ibuprofen for fever/pain. I recommend close follow-up with PCP for reevaluation.  Please do not hesitate to return to emergency department if worrisome signs symptoms we discussed become apparent.

## 2023-07-21 ENCOUNTER — Telehealth (INDEPENDENT_AMBULATORY_CARE_PROVIDER_SITE_OTHER): Payer: 59 | Admitting: Internal Medicine

## 2023-07-21 ENCOUNTER — Encounter: Payer: Self-pay | Admitting: Internal Medicine

## 2023-07-21 ENCOUNTER — Telehealth: Payer: Self-pay | Admitting: Internal Medicine

## 2023-07-21 DIAGNOSIS — U071 COVID-19: Secondary | ICD-10-CM | POA: Diagnosis not present

## 2023-07-21 NOTE — Telephone Encounter (Signed)
Lvm

## 2023-07-21 NOTE — Progress Notes (Signed)
Virtual Visit via Video Note   Because of Susan Lara's co-morbid illnesses, she is at least at moderate risk for complications without adequate follow up.  This format is felt to be most appropriate for this patient at this time.  All issues noted in this document were discussed and addressed.  A limited physical exam was performed with this format.      Evaluation Performed:  Follow-up visit  Date:  07/21/2023   ID:  Susan Lara, DOB 03/17/1996, MRN [id number]  Patient Location: Home Provider Location: Office/Clinic  Participants: Patient Location of Patient: Home Location of Provider: Telehealth Consent was obtain for visit to be over via telehealth. I verified that I am speaking with the correct person using two identifiers.  PCP:  Anabel Halon, MD   Chief Complaint: Nasal congestion  History of Present Illness:    Susan Lara is a 27 y.o. female who has a video visit for complaint of nasal congestion, cough, sore throat and nausea for the last 2 days.  She tested positive for COVID yesterday.  She went to ER on 08/12 and was given Zofran for nausea and was told to take symptomatic treatment.  She currently denies any dyspnea wheezing.  The patient does have symptoms concerning for COVID-19 infection (fever, chills, cough, or new shortness of breath).   Past Medical, Surgical, Social History, Allergies, and Medications have been Reviewed.  Past Medical History:  Diagnosis Date   Candida vaginitis 07/08/2020   Medical history non-contributory    Normal labor 07/10/2020   Past Surgical History:  Procedure Laterality Date   ESOPHAGEAL MANOMETRY N/A 03/07/2018   Procedure: ESOPHAGEAL MANOMETRY (EM);  Surgeon: Jeani Hawking, MD;  Location: [facility];  Service: Endoscopy;  Laterality: N/A;   SINUS EXPLORATION  2011     No outpatient medications have been marked as taking for the 07/21/23 encounter (Video Visit) with Anabel Halon, MD.      Allergies:   Patient has no known allergies.   ROS:   Please see the history of present illness.     All other systems reviewed and are negative.   Labs/Other Tests and Data Reviewed:    Recent Labs: 07/19/2023: ALT 12; BUN 13; Creatinine, Ser 0.76; Hemoglobin 14.5; Platelets 203; Potassium 3.4; Sodium 134   Recent Lipid Panel No results found for: "CHOL", "TRIG", "HDL", "CHOLHDL", "LDLCALC", "LDLDIRECT"  Wt Readings from Last 3 Encounters:  07/19/23 113 lb (51.3 kg)  02/01/23 113 lb (51.3 kg)  09/19/22 113 lb (51.3 kg)     Objective:    Vital Signs:  LMP 07/05/2023    VITAL SIGNS:  reviewed GEN:  no acute distress EYES:  sclerae anicteric, EOMI - Extraocular Movements Intact RESPIRATORY:  normal respiratory effort, symmetric expansion NEURO:  alert and oriented x 3, no obvious focal deficit PSYCH:  normal affect  ASSESSMENT & PLAN:    COVID-19 infection Since she is young and healthy individual, with relatively mild symptoms currently -advised to continue symptomatic treatment If her symptoms get worse by tomorrow, will plan to start Paxlovid Advised to take Mucinex or Robitussin as needed for cough Nasal saline spray as needed for nasal congestion ER chart reviewed, including blood tests Advised to maintain adequate hydration Advised to self quarantine for at least 5 days from the symptom onset or 24 hours of afebrile period - whichever is later, work note provided  The patient was advised to call back or seek an in-person evaluation  if the symptoms worsen or if the condition fails to improve as anticipated.  The above assessment and management plan was discussed with the patient. The patient verbalized understanding of and has agreed to the management plan.   Medication Adjustments/Labs and Tests Ordered: Current medicines are reviewed at length with the patient today.  Concerns regarding medicines are outlined above.   Tests Ordered: No orders of the defined  types were placed in this encounter.   Medication Changes: No orders of the defined types were placed in this encounter.    Note: This dictation was prepared with Dragon dictation along with smaller phrase technology. Similar sounding words can be transcribed inadequately or may not be corrected upon review. Any transcriptional errors that result from this process are unintentional.      Disposition:  Follow up  Signed, Anabel Halon, MD  07/21/2023 2:53 PM     [facility]

## 2023-07-21 NOTE — Telephone Encounter (Signed)
Patient calling says she tested positive for Covid, ED gave her a note to return to work tomorrow 8/14 but she has not been quarantined 5 days- requesting an updated note. Symptoms started Sunday, tested positive yesterday. Please advise Thank you

## 2023-08-08 DIAGNOSIS — Z419 Encounter for procedure for purposes other than remedying health state, unspecified: Secondary | ICD-10-CM | POA: Diagnosis not present

## 2023-08-19 DIAGNOSIS — Z6820 Body mass index (BMI) 20.0-20.9, adult: Secondary | ICD-10-CM | POA: Diagnosis not present

## 2023-08-19 DIAGNOSIS — S93409A Sprain of unspecified ligament of unspecified ankle, initial encounter: Secondary | ICD-10-CM | POA: Diagnosis not present

## 2023-08-31 ENCOUNTER — Ambulatory Visit: Payer: 59 | Admitting: Internal Medicine

## 2023-09-07 DIAGNOSIS — Z419 Encounter for procedure for purposes other than remedying health state, unspecified: Secondary | ICD-10-CM | POA: Diagnosis not present

## 2023-09-08 ENCOUNTER — Ambulatory Visit: Payer: 59 | Admitting: Internal Medicine

## 2023-09-08 ENCOUNTER — Ambulatory Visit: Payer: 59

## 2023-09-08 DIAGNOSIS — Z309 Encounter for contraceptive management, unspecified: Secondary | ICD-10-CM

## 2023-09-21 ENCOUNTER — Encounter: Payer: 59 | Admitting: Internal Medicine

## 2023-10-08 DIAGNOSIS — Z419 Encounter for procedure for purposes other than remedying health state, unspecified: Secondary | ICD-10-CM | POA: Diagnosis not present

## 2023-11-07 DIAGNOSIS — Z419 Encounter for procedure for purposes other than remedying health state, unspecified: Secondary | ICD-10-CM | POA: Diagnosis not present

## 2023-11-22 ENCOUNTER — Other Ambulatory Visit: Payer: Self-pay

## 2023-11-22 ENCOUNTER — Emergency Department (HOSPITAL_COMMUNITY): Payer: 59

## 2023-11-22 ENCOUNTER — Emergency Department (HOSPITAL_COMMUNITY)
Admission: EM | Admit: 2023-11-22 | Discharge: 2023-11-22 | Disposition: A | Discharge status: Home / Self Care | Payer: 59 | Attending: Emergency Medicine | Admitting: Emergency Medicine

## 2023-11-22 ENCOUNTER — Encounter (HOSPITAL_COMMUNITY): Payer: Self-pay

## 2023-11-22 DIAGNOSIS — R1012 Left upper quadrant pain: Secondary | ICD-10-CM | POA: Insufficient documentation

## 2023-11-22 DIAGNOSIS — Z20822 Contact with and (suspected) exposure to covid-19: Secondary | ICD-10-CM | POA: Diagnosis not present

## 2023-11-22 DIAGNOSIS — R059 Cough, unspecified: Secondary | ICD-10-CM | POA: Diagnosis present

## 2023-11-22 DIAGNOSIS — Z8616 Personal history of COVID-19: Secondary | ICD-10-CM | POA: Insufficient documentation

## 2023-11-22 DIAGNOSIS — J189 Pneumonia, unspecified organism: Secondary | ICD-10-CM | POA: Insufficient documentation

## 2023-11-22 DIAGNOSIS — R000 Tachycardia, unspecified: Secondary | ICD-10-CM | POA: Insufficient documentation

## 2023-11-22 LAB — CBC WITH DIFFERENTIAL/PLATELET
Abs Immature Granulocytes: 0.05 10*3/uL (ref 0.00–0.07)
Basophils Absolute: 0 10*3/uL (ref 0.0–0.1)
Basophils Relative: 0 %
Eosinophils Absolute: 0.1 10*3/uL (ref 0.0–0.5)
Eosinophils Relative: 1 %
HCT: 41.1 % (ref 36.0–46.0)
Hemoglobin: 13.7 g/dL (ref 12.0–15.0)
Immature Granulocytes: 1 %
Lymphocytes Relative: 9 %
Lymphs Abs: 0.9 10*3/uL (ref 0.7–4.0)
MCH: 28.4 pg (ref 26.0–34.0)
MCHC: 33.3 g/dL (ref 30.0–36.0)
MCV: 85.1 fL (ref 80.0–100.0)
Monocytes Absolute: 0.7 10*3/uL (ref 0.1–1.0)
Monocytes Relative: 8 %
Neutro Abs: 7.7 10*3/uL (ref 1.7–7.7)
Neutrophils Relative %: 81 %
Platelets: 198 10*3/uL (ref 150–400)
RBC: 4.83 MIL/uL (ref 3.87–5.11)
RDW: 13 % (ref 11.5–15.5)
WBC: 9.4 10*3/uL (ref 4.0–10.5)
nRBC: 0 % (ref 0.0–0.2)

## 2023-11-22 LAB — URINALYSIS, ROUTINE W REFLEX MICROSCOPIC
Bilirubin Urine: NEGATIVE
Glucose, UA: NEGATIVE mg/dL
Hgb urine dipstick: NEGATIVE
Ketones, ur: 20 mg/dL — AB
Nitrite: NEGATIVE
Protein, ur: 100 mg/dL — AB
Specific Gravity, Urine: 1.029 (ref 1.005–1.030)
pH: 5 (ref 5.0–8.0)

## 2023-11-22 LAB — HCG, SERUM, QUALITATIVE: Preg, Serum: NEGATIVE

## 2023-11-22 LAB — CBC
HCT: 40.6 % (ref 36.0–46.0)
Hemoglobin: 13.5 g/dL (ref 12.0–15.0)
MCH: 28.4 pg (ref 26.0–34.0)
MCHC: 33.3 g/dL (ref 30.0–36.0)
MCV: 85.3 fL (ref 80.0–100.0)
Platelets: 203 10*3/uL (ref 150–400)
RBC: 4.76 MIL/uL (ref 3.87–5.11)
RDW: 12.9 % (ref 11.5–15.5)
WBC: 9.4 10*3/uL (ref 4.0–10.5)
nRBC: 0 % (ref 0.0–0.2)

## 2023-11-22 LAB — COMPREHENSIVE METABOLIC PANEL
ALT: 11 U/L (ref 0–44)
AST: 21 U/L (ref 15–41)
Albumin: 3.9 g/dL (ref 3.5–5.0)
Alkaline Phosphatase: 55 U/L (ref 38–126)
Anion gap: 11 (ref 5–15)
BUN: 10 mg/dL (ref 6–20)
CO2: 22 mmol/L (ref 22–32)
Calcium: 8.9 mg/dL (ref 8.9–10.3)
Chloride: 104 mmol/L (ref 98–111)
Creatinine, Ser: 0.91 mg/dL (ref 0.44–1.00)
GFR, Estimated: >60 mL/min (ref >60)
Glucose, Bld: 91 mg/dL (ref 70–99)
Potassium: 3.8 mmol/L (ref 3.5–5.1)
Sodium: 137 mmol/L (ref 135–145)
Total Bilirubin: 0.9 mg/dL (ref <1.2)
Total Protein: 7 g/dL (ref 6.5–8.1)

## 2023-11-22 LAB — RESP PANEL BY RT-PCR (RSV, FLU A&B, COVID)  RVPGX2
Influenza A by PCR: NEGATIVE
Influenza B by PCR: NEGATIVE
Resp Syncytial Virus by PCR: NEGATIVE
SARS Coronavirus 2 by RT PCR: NEGATIVE

## 2023-11-22 LAB — LIPASE, BLOOD: Lipase: 24 U/L (ref 11–51)

## 2023-11-22 MED ORDER — ACETAMINOPHEN 325 MG PO TABS: 650.0000 mg | ORAL_TABLET | Freq: Four times a day (QID) | ORAL | 0 refills | Status: AC | PRN

## 2023-11-22 MED ORDER — GUAIFENESIN-DM 100-10 MG/5ML PO SYRP: 5.0000 mL | ORAL_SOLUTION | ORAL | 0 refills | Status: DC | PRN

## 2023-11-22 MED ORDER — ONDANSETRON HCL 4 MG PO TABS: 4.0000 mg | ORAL_TABLET | ORAL | 0 refills | Status: DC | PRN

## 2023-11-22 MED ORDER — AMOXICILLIN-POT CLAVULANATE 875-125 MG PO TABS: 1.0000 | ORAL_TABLET | Freq: Two times a day (BID) | ORAL | 0 refills | Status: DC

## 2023-11-22 MED ORDER — SODIUM CHLORIDE 0.9 % IV BOLUS
1000.0000 mL | Freq: Once | INTRAVENOUS | Status: AC
Start: 2023-11-22 — End: 2023-11-22
  Administered 2023-11-22: 1000 mL via INTRAVENOUS

## 2023-11-22 MED ORDER — ALUM & MAG HYDROXIDE-SIMETH 200-200-20 MG/5ML PO SUSP
30.0000 mL | Freq: Once | ORAL | Status: AC
  Administered 2023-11-22: 30 mL via ORAL
  Filled 2023-11-22: qty 30

## 2023-11-22 MED ORDER — IOHEXOL 350 MG/ML SOLN
75.0000 mL | Freq: Once | INTRAVENOUS | Status: AC | PRN
  Administered 2023-11-22: 75 mL via INTRAVENOUS

## 2023-11-22 MED ORDER — LIDOCAINE VISCOUS HCL 2 % MT SOLN
15.0000 mL | Freq: Once | OROMUCOSAL | Status: AC
  Administered 2023-11-22: 15 mL via ORAL
  Filled 2023-11-22: qty 15

## 2023-11-22 MED ORDER — AZITHROMYCIN 250 MG PO TABS: 250.0000 mg | ORAL_TABLET | Freq: Every day | ORAL | 0 refills | Status: DC

## 2023-11-22 MED ORDER — IBUPROFEN 600 MG PO TABS: 600.0000 mg | ORAL_TABLET | Freq: Four times a day (QID) | ORAL | 0 refills | Status: DC | PRN

## 2023-11-22 NOTE — ED Triage Notes (Addendum)
Patient report vomiting and chills starting Saturday, unable to tolerate food. Patient also reports migraine, lower back, and cough as well. Patient took robutussin and singulair with no relief. No urinary symptoms.

## 2023-11-22 NOTE — Discharge Instructions (Signed)
It was a pleasure caring for you today in the emergency department.  Drink plenty fluids get plenty of rest, follow-up with your PCP.  Return here for any worsening worrisome symptoms such as worsening nausea and vomiting, difficulty breathing, worsening symptoms, etc.

## 2023-11-22 NOTE — ED Provider Notes (Signed)
[facility] Provider Note  CSN: [id number] Arrival date & time: 11/22/23 7829  Chief Complaint(s) Emesis and Migraine  HPI Susan Lara is a 27 y.o. female with past medical history as below, significant for dysphagia, who presents to the ED with complaint of URI symptoms, body aches, cough, headache, nausea vomiting  Onset of symptoms Saturday, intermittent headache, aching, throbbing at times.  Left upper quadrant abdominal pain, nausea and vomiting.  Poor p.o. intake last 24 hours due to nausea vomiting.  No change in bowel or bladder function, no BRBPR or melena.  No abnormal vaginal bleeding.  Having some intermittent coughing, occasionally productive with white or clear sputum, no dyspnea.  Chills, body aches but no fevers.  Last p.o. was pizza and Sprite last night which caused her to vomit.  No vomiting this morning.  No headache at this time.  She went to work this morning that was sent to the hospital for evaluation at the request of her supervisor.  Past Medical History Past Medical History:  Diagnosis Date   Candida vaginitis 07/08/2020   Medical history non-contributory    Normal labor 07/10/2020   Patient Active Problem List   Diagnosis Date Noted   Close exposure to COVID-19 virus 02/02/2023   Abdominal pain 02/02/2023   RLQ abdominal pain 08/26/2022   Nausea 08/26/2022   Cystitis 08/07/2022   Cough 06/18/2022   Encounter for general adult medical examination with abnormal findings 05/30/2022   Dysphagia 02/11/2021   Head injury 01/22/2021   Home Medication(s) Prior to Admission medications   Medication Sig Start Date End Date Taking? Authorizing Provider  dicyclomine (BENTYL) 20 MG tablet Take 1 tablet (20 mg total) by mouth every 6 (six) hours as needed for spasms. 02/02/23   Billie Lade, MD  ibuprofen (ADVIL) 600 MG tablet Take 1 tablet (600 mg total) by mouth every 6 (six) hours as needed for up to 30 doses for  moderate pain, mild pain or headache (Muscle aches). 02/02/23   Billie Lade, MD  ondansetron (ZOFRAN-ODT) 4 MG disintegrating tablet Take 1 tablet (4 mg total) by mouth every 8 (eight) hours as needed for nausea or vomiting. 07/19/23   Jeanelle Malling, PA                                                                                                                                    Past Surgical History Past Surgical History:  Procedure Laterality Date   ESOPHAGEAL MANOMETRY N/A 03/07/2018   Procedure: ESOPHAGEAL MANOMETRY (EM);  Surgeon: Jeani Hawking, MD;  Location: [facility];  Service: Endoscopy;  Laterality: N/A;   SINUS EXPLORATION  2011   Family History Family History  Problem Relation Age of Onset   Cancer Paternal Grandmother        breast   Cancer Maternal Grandfather        brain  Social History Social History   Tobacco Use   Smoking status: Never   Smokeless tobacco: Never  Vaping Use   Vaping status: Never Used  Substance Use Topics   Alcohol use: Not Currently   Drug use: Never   Allergies Patient has no known allergies.  Review of Systems Review of Systems  Constitutional:  Positive for chills. Negative for fever.  HENT:  Positive for congestion.   Respiratory:  Positive for cough and chest tightness. Negative for shortness of breath.   Cardiovascular:  Negative for chest pain and palpitations.  Gastrointestinal:  Positive for abdominal pain, nausea and vomiting.  Genitourinary:  Negative for difficulty urinating and dysuria.  Musculoskeletal:  Positive for arthralgias and back pain.  Skin:  Negative for rash and wound.  Neurological:  Positive for headaches. Negative for light-headedness and numbness.  All other systems reviewed and are negative.   Physical Exam Vital Signs  I have reviewed the triage vital signs BP 113/68 (BP Location: Right Arm)   Pulse (!) 102   Temp 98.2 F (36.8 C) (Oral)   Resp 16   Ht 5\' 2"  (1.575 m)   Wt 52.2 kg   SpO2  100%   BMI 21.03 kg/m  Physical Exam Vitals and nursing note reviewed.  Constitutional:      General: She is not in acute distress.    Appearance: Normal appearance.  HENT:     Head: Normocephalic and atraumatic.     Right Ear: External ear normal.     Left Ear: External ear normal.     Nose: Nose normal.     Mouth/Throat:     Mouth: Mucous membranes are moist.  Eyes:     General: No scleral icterus.       Right eye: No discharge.        Left eye: No discharge.  Cardiovascular:     Rate and Rhythm: Regular rhythm. Tachycardia present.     Pulses: Normal pulses.     Heart sounds: Normal heart sounds.     No S3 or S4 sounds.  Pulmonary:     Effort: Pulmonary effort is normal. No respiratory distress.     Breath sounds: Normal breath sounds. No stridor.  Abdominal:     General: Abdomen is flat. There is no distension.     Palpations: Abdomen is soft.     Tenderness: There is abdominal tenderness in the left upper quadrant. There is no guarding or rebound.       Comments: Mild ttp   Musculoskeletal:     Cervical back: No rigidity.     Right lower leg: No edema.     Left lower leg: No edema.  Skin:    General: Skin is warm and dry.     Capillary Refill: Capillary refill takes less than 2 seconds.  Neurological:     Mental Status: She is alert.  Psychiatric:        Mood and Affect: Mood normal.        Behavior: Behavior normal. Behavior is cooperative.     ED Results and Treatments Labs (all labs ordered are listed, but only abnormal results are displayed) Labs Reviewed  RESP PANEL BY RT-PCR (RSV, FLU A&B, COVID)  RVPGX2  COMPREHENSIVE METABOLIC PANEL  CBC  URINALYSIS, ROUTINE W REFLEX MICROSCOPIC  HCG, SERUM, QUALITATIVE  DIFFERENTIAL  LIPASE, BLOOD  Radiology No results found.  Pertinent labs & imaging results that were available during  my care of the patient were reviewed by me and considered in my medical decision making (see MDM for details).  Medications Ordered in ED Medications  alum & mag hydroxide-simeth (MAALOX/MYLANTA) 200-200-20 MG/5ML suspension 30 mL (has no administration in time range)    And  lidocaine (XYLOCAINE) 2 % viscous mouth solution 15 mL (has no administration in time range)  sodium chloride 0.9 % bolus 1,000 mL (has no administration in time range)                                                                                                                                     Procedures Procedures  (including critical care time)  Medical Decision Making / ED Course    Medical Decision Making:    Susan Lara is a 27 y.o. female  with past medical history as below, significant for dysphagia, who presents to the ED with complaint of URI symptoms, body aches, cough, headache, nausea vomiting . The complaint involves an extensive differential diagnosis and also carries with it a high risk of complications and morbidity.  Serious etiology was considered. Ddx includes but is not limited to: Differential diagnosis includes but is not exclusive to URI, gastroenteritis, enteritis, foodborne illness, gastritis, duodenitis, pancreatitis, pneumonia, etc.  Complete initial physical exam performed, notably the patient was in no acute distress, she is well-appearing, heart rate is mildly elevated.    Reviewed and confirmed nursing documentation for past medical history, family history, social history.  Vital signs reviewed.    Patient with onset of symptoms over the past 3 days.  Somewhat improved since the onset.  No longer having headache.  Get screening labs, give GI cocktail, get chest x-ray, RVP, fluids, recheck     Brief summary:                 Additional history obtained: -Additional history obtained from na -External records from outside source obtained and reviewed  including: Chart review including previous notes, labs, imaging, consultation notes including  Primary care documentation, prior ED visits, home medications   Lab Tests: -I ordered, reviewed, and interpreted labs.   The pertinent results include:   Labs Reviewed  RESP PANEL BY RT-PCR (RSV, FLU A&B, COVID)  RVPGX2  COMPREHENSIVE METABOLIC PANEL  CBC  URINALYSIS, ROUTINE W REFLEX MICROSCOPIC  HCG, SERUM, QUALITATIVE  DIFFERENTIAL  LIPASE, BLOOD    Notable for ***  EKG   EKG Interpretation Date/Time:    Ventricular Rate:    PR Interval:    QRS Duration:    QT Interval:    QTC Calculation:   R Axis:      Text Interpretation:           Imaging Studies ordered: I ordered imaging studies including *** I independently visualized the following imaging with scope of interpretation limited  to determining acute life threatening conditions related to emergency care; findings noted above I independently visualized and interpreted imaging. I agree with the radiologist interpretation   Medicines ordered and prescription drug management: Meds ordered this encounter  Medications   AND Linked Order Group    alum & mag hydroxide-simeth (MAALOX/MYLANTA) 200-200-20 MG/5ML suspension 30 mL    lidocaine (XYLOCAINE) 2 % viscous mouth solution 15 mL   sodium chloride 0.9 % bolus 1,000 mL    -I have reviewed the patients home medicines and have made adjustments as needed   Consultations Obtained: I requested consultation with the ***,  and discussed lab and imaging findings as well as pertinent plan - they recommend: ***   Cardiac Monitoring: Continuous pulse oximetry interpreted by myself, 100% on RA.    Social Determinants of Health:  Diagnosis or treatment significantly limited by social determinants of health: lives at home   Reevaluation: After the interventions noted above, I reevaluated the patient and found that they have improved  Co morbidities that complicate the  patient evaluation  Past Medical History:  Diagnosis Date   Candida vaginitis 07/08/2020   Medical history non-contributory    Normal labor 07/10/2020      Dispostion: Disposition decision including need for hospitalization was considered, and patient {[id number]::"discharged from emergency department."}    Final Clinical Impression(s) / ED Diagnoses Final diagnoses:  None

## 2023-11-24 ENCOUNTER — Encounter: Payer: Self-pay | Admitting: Internal Medicine

## 2023-11-25 ENCOUNTER — Other Ambulatory Visit: Payer: Self-pay | Admitting: Internal Medicine

## 2023-11-25 DIAGNOSIS — J189 Pneumonia, unspecified organism: Secondary | ICD-10-CM

## 2023-11-25 MED ORDER — PROMETHAZINE-DM 6.25-15 MG/5ML PO SYRP: 5.0000 mL | ORAL_SOLUTION | Freq: Four times a day (QID) | ORAL | 0 refills | Status: DC | PRN

## 2023-12-08 DIAGNOSIS — Z419 Encounter for procedure for purposes other than remedying health state, unspecified: Secondary | ICD-10-CM | POA: Diagnosis not present

## 2024-01-08 DIAGNOSIS — Z419 Encounter for procedure for purposes other than remedying health state, unspecified: Secondary | ICD-10-CM | POA: Diagnosis not present

## 2024-02-05 DIAGNOSIS — Z419 Encounter for procedure for purposes other than remedying health state, unspecified: Secondary | ICD-10-CM | POA: Diagnosis not present

## 2024-02-15 ENCOUNTER — Encounter

## 2024-02-17 ENCOUNTER — Encounter: Payer: Self-pay | Admitting: Internal Medicine

## 2024-02-17 ENCOUNTER — Telehealth: Admitting: Internal Medicine

## 2024-02-17 DIAGNOSIS — J0110 Acute frontal sinusitis, unspecified: Secondary | ICD-10-CM | POA: Diagnosis not present

## 2024-02-17 DIAGNOSIS — J0111 Acute recurrent frontal sinusitis: Secondary | ICD-10-CM | POA: Insufficient documentation

## 2024-02-17 MED ORDER — AMOXICILLIN-POT CLAVULANATE 875-125 MG PO TABS: 1.0000 | ORAL_TABLET | Freq: Two times a day (BID) | ORAL | 0 refills | Status: DC

## 2024-02-17 MED ORDER — PROMETHAZINE-DM 6.25-15 MG/5ML PO SYRP: 5.0000 mL | ORAL_SOLUTION | Freq: Four times a day (QID) | ORAL | 0 refills | Status: DC | PRN

## 2024-02-17 NOTE — Assessment & Plan Note (Signed)
 Has URTI symptoms Has persistent cough since 12/24 when she went to ER, but worse in the last week Started empiric Augmentin Promethazine-DM syrup as needed for cough Maintain adequate hydration Advised to perform warm water gargling and use phenol throat spray as needed for sore throat

## 2024-02-17 NOTE — Patient Instructions (Addendum)
 Please start taking Augmentin as prescribed.  Please take Promethazine DM as needed for cough.  Please perform warm water gargling and use phenol throat spray as needed for sore throat.  Please maintain adequate hydration.

## 2024-02-17 NOTE — Progress Notes (Signed)
 Virtual Visit via Video Note   Because of Susan Lara's co-morbid illnesses, she is at least at moderate risk for complications without adequate follow up.  This format is felt to be most appropriate for this patient at this time.  All issues noted in this document were discussed and addressed.  A limited physical exam was performed with this format.   Evaluation Performed:  Follow-up visit  Date:  02/17/2024   ID:  Susan Lara, DOB 11/13/1996, MRN [id number]  Patient Location: Home Provider Location: Office/Clinic  Participants: Patient Location of Patient: Home Location of Provider: Telehealth Consent was obtain for visit to be over via telehealth. I verified that I am speaking with the correct person using two identifiers.  PCP:  Anabel Halon, MD   Chief Complaint: Cough, nasal congestion and sore throat  History of Present Illness:    Susan Lara is a 28 y.o. female who has a video visit for complaint of cough, nasal congestion and sore throat for the last 5 days.  Her home COVID and flu test were negative.  She has tried taking OTC Robitussin without much relief.  Denies any fever, chills, dyspnea or wheezing currently.  The patient does not have symptoms concerning for COVID-19 infection (fever, chills, cough, or new shortness of breath).   Past Medical, Surgical, Social History, Allergies, and Medications have been Reviewed.  Past Medical History:  Diagnosis Date   Candida vaginitis 07/08/2020   Medical history non-contributory    Normal labor 07/10/2020   Past Surgical History:  Procedure Laterality Date   ESOPHAGEAL MANOMETRY N/A 03/07/2018   Procedure: ESOPHAGEAL MANOMETRY (EM);  Surgeon: Jeani Hawking, MD;  Location: [facility];  Service: Endoscopy;  Laterality: N/A;   SINUS EXPLORATION  2011     No outpatient medications have been marked as taking for the 02/17/24 encounter (Video Visit) with Anabel Halon, MD.     Allergies:    Patient has no known allergies.   ROS:   Please see the history of present illness. All other systems reviewed and are negative.   Labs/Other Tests and Data Reviewed:    Recent Labs: 11/22/2023: ALT 11; BUN 10; Creatinine, Ser 0.91; Hemoglobin 13.5; Hemoglobin 13.7; Platelets 203; Platelets 198; Potassium 3.8; Sodium 137   Recent Lipid Panel No results found for: "CHOL", "TRIG", "HDL", "CHOLHDL", "LDLCALC", "LDLDIRECT"  Wt Readings from Last 3 Encounters:  11/22/23 115 lb (52.2 kg)  07/19/23 113 lb (51.3 kg)  02/01/23 113 lb (51.3 kg)     Objective:    Vital Signs:  There were no vitals taken for this visit.   VITAL SIGNS:  reviewed GEN:  no acute distress EYES:  sclerae anicteric, EOMI - Extraocular Movements Intact RESPIRATORY:  normal respiratory effort, symmetric expansion NEURO:  alert and oriented x 3, no obvious focal deficit PSYCH:  normal affect  ASSESSMENT & PLAN:    Acute non-recurrent frontal sinusitis Has URTI symptoms Has persistent cough since 12/24 when she went to ER, but worse in the last week Started empiric Augmentin Promethazine-DM syrup as needed for cough Maintain adequate hydration Advised to perform warm water gargling and use phenol throat spray as needed for sore throat  I discussed the assessment and treatment plan with the patient. The patient was provided an opportunity to ask questions, and all were answered. The patient agreed with the plan and demonstrated an understanding of the instructions.   The patient was advised to call back or seek  an in-person evaluation if the symptoms worsen or if the condition fails to improve as anticipated.  The above assessment and management plan was discussed with the patient. The patient verbalized understanding of and has agreed to the management plan.   Medication Adjustments/Labs and Tests Ordered: Current medicines are reviewed at length with the patient today.  Concerns regarding medicines are  outlined above.   Tests Ordered: No orders of the defined types were placed in this encounter.   Medication Changes: No orders of the defined types were placed in this encounter.    Note: This dictation was prepared with Dragon dictation along with smaller phrase technology. Similar sounding words can be transcribed inadequately or may not be corrected upon review. Any transcriptional errors that result from this process are unintentional.      Disposition:  Follow up  Signed, Anabel Halon, MD  02/17/2024 4:05 PM     [facility]

## 2024-03-15 ENCOUNTER — Telehealth: Admitting: Physician Assistant

## 2024-03-15 DIAGNOSIS — R058 Other specified cough: Secondary | ICD-10-CM

## 2024-03-15 MED ORDER — FLUTICASONE PROPIONATE 50 MCG/ACT NA SUSP: 2.0000 | Freq: Every day | NASAL | 0 refills | Status: DC

## 2024-03-15 MED ORDER — BENZONATATE 100 MG PO CAPS: 100.0000 mg | ORAL_CAPSULE | Freq: Three times a day (TID) | ORAL | 0 refills | Status: DC | PRN

## 2024-03-15 MED ORDER — CETIRIZINE HCL 10 MG PO TABS: 10.0000 mg | ORAL_TABLET | Freq: Every day | ORAL | 0 refills | Status: DC

## 2024-03-15 NOTE — Progress Notes (Signed)
 E-Visit for Cough   We are sorry that you are not feeling well.  Here is how we plan to help!  Based on your presentation I believe you most likely have A cough due to allergies.  I recommend that you start an allergy medication such as Zyrtec 10 mg daily, I have prescribed this for you.  I also recommend Fluticasone nasal spray Use 2 sprays in each nostril daily for 14 days.   In addition you may use A non-prescription cough medication called Mucinex DM: take 2 tablets every 12 hours. and A prescription cough medication called Tessalon Perles 100mg . You may take 1-2 capsules every 8 hours as needed for your cough.  From your responses in the eVisit questionnaire you describe inflammation in the upper respiratory tract which is causing a significant cough.  This is commonly called Bronchitis and has four common causes:   Allergies Viral Infections Acid Reflux Bacterial Infection Allergies, viruses and acid reflux are treated by controlling symptoms or eliminating the cause. An example might be a cough caused by taking certain blood pressure medications. You stop the cough by changing the medication. Another example might be a cough caused by acid reflux. Controlling the reflux helps control the cough.    HOME CARE Only take medications as instructed by your medical team. Complete the entire course of an antibiotic. Drink plenty of fluids and get plenty of rest. Avoid close contacts especially the very young and the elderly Cover your mouth if you cough or cough into your sleeve. Always remember to wash your hands A steam or ultrasonic humidifier can help congestion.   GET HELP RIGHT AWAY IF: You develop worsening fever. You become short of breath You cough up blood. Your symptoms persist after you have completed your treatment plan MAKE SURE YOU  Understand these instructions. Will watch your condition. Will get help right away if you are not doing well or get worse.    Thank you  for choosing an e-visit.  Your e-visit answers were reviewed by a board certified advanced clinical practitioner to complete your personal care plan. Depending upon the condition, your plan could have included both over the counter or prescription medications.  Please review your pharmacy choice. Make sure the pharmacy is open so you can pick up prescription now. If there is a problem, you may contact your provider through [employer] and have the prescription routed to another pharmacy.  Your safety is important to [place]. If you have drug allergies check your prescription carefully.   For the next 24 hours you can use MyChart to ask questions about today's visit, request a non-urgent call back, or ask for a work or school excuse. You will get an email in the next two days asking about your experience. I hope that your e-visit has been valuable and will speed your recovery.   I have spent 5 minutes in review of e-visit questionnaire, review and updating patient chart, medical decision making and response to patient.   Margaretann Loveless, PA-C

## 2024-03-18 DIAGNOSIS — Z419 Encounter for procedure for purposes other than remedying health state, unspecified: Secondary | ICD-10-CM | POA: Diagnosis not present

## 2024-03-22 ENCOUNTER — Telehealth: Payer: Self-pay

## 2024-03-22 NOTE — Telephone Encounter (Signed)
 Copied from CRM [id number]. Topic: Appointments - Scheduling Inquiry for Clinic >> Mar 22, 2024 11:01 AM Oddis Bench wrote: Reason for CRM: Patient is calling to see if she can get the appt for 04/17 changed to a video visit was not able to do so due to being Hospital follow up.

## 2024-03-22 NOTE — Telephone Encounter (Signed)
 Addressed pt has been moved to a video

## 2024-03-23 ENCOUNTER — Encounter: Payer: Self-pay | Admitting: Internal Medicine

## 2024-03-23 ENCOUNTER — Telehealth (INDEPENDENT_AMBULATORY_CARE_PROVIDER_SITE_OTHER): Admitting: Internal Medicine

## 2024-03-23 DIAGNOSIS — J309 Allergic rhinitis, unspecified: Secondary | ICD-10-CM | POA: Diagnosis not present

## 2024-03-23 MED ORDER — METHYLPREDNISOLONE 4 MG PO TBPK: ORAL_TABLET | ORAL | 0 refills | Status: DC

## 2024-03-23 MED ORDER — FLUTICASONE PROPIONATE 50 MCG/ACT NA SUSP
2.0000 | Freq: Every day | NASAL | 1 refills | Status: DC
Start: 2024-03-23 — End: 2024-06-06

## 2024-03-23 NOTE — Patient Instructions (Signed)
 Please start taking prednisone as prescribed.  Please use Flonase for allergies/nasal congestion.  Please start taking Zyrtec 10 mg once daily for allergies.  Please perform warm water gargling.  Please perform sinus rinse and/or use vaporizer for nasal congestion.

## 2024-03-23 NOTE — Assessment & Plan Note (Signed)
 Her symptoms are likely due to pollen allergy Medrol Dosepak Flonase prescribed Advised to start taking Zyrtec 10 mg once daily Advised to perform warm water gargling Sinus rinse and/or use of vaporizer for nasal congestion

## 2024-03-23 NOTE — Progress Notes (Signed)
 Virtual Visit via Video Note   Because of Susan Lara's co-morbid illnesses, she is at least at moderate risk for complications without adequate follow up.  This format is felt to be most appropriate for this patient at this time.  All issues noted in this document were discussed and addressed.  A limited physical exam was performed with this format.   Evaluation Performed:  Follow-up visit  Date:  03/23/2024   ID:  Susan Lara, DOB 06-Jul-1996, MRN [id number]  Patient Location: Home Provider Location: Office/Clinic  Participants: Patient Location of Patient: Home Location of Provider: Telehealth Consent was obtain for visit to be over via telehealth. I verified that I am speaking with the correct person using two identifiers.  PCP:  Anabel Halon, MD   Chief Complaint: Cough, nasal congestion and sore throat  History of Present Illness:    Susan Lara is a 28 y.o. female who has a video visit for complaint of cough, nasal congestion and sore throat for the last 3 weeks.  Her home COVID and flu test were negative.  She has tried taking OTC Robitussin without much relief.  She was given Augmentin and promethazine-DM syrup in the last visit, which did not help her symptoms. Denies any fever, chills, dyspnea or wheezing currently.  The patient does not have symptoms concerning for COVID-19 infection (fever, chills, cough, or new shortness of breath).   Past Medical, Surgical, Social History, Allergies, and Medications have been Reviewed.  Past Medical History:  Diagnosis Date   Candida vaginitis 07/08/2020   Medical history non-contributory    Normal labor 07/10/2020   Past Surgical History:  Procedure Laterality Date   ESOPHAGEAL MANOMETRY N/A 03/07/2018   Procedure: ESOPHAGEAL MANOMETRY (EM);  Surgeon: Jeani Hawking, MD;  Location: [facility];  Service: Endoscopy;  Laterality: N/A;   SINUS EXPLORATION  2011     Current Meds  Medication Sig    acetaminophen (TYLENOL) 325 MG tablet Take 2 tablets (650 mg total) by mouth every 6 (six) hours as needed.   benzonatate (TESSALON) 100 MG capsule Take 1-2 capsules (100-200 mg total) by mouth 3 (three) times daily as needed.   cetirizine (ZYRTEC) 10 MG tablet Take 1 tablet (10 mg total) by mouth daily.   dicyclomine (BENTYL) 20 MG tablet Take 1 tablet (20 mg total) by mouth every 6 (six) hours as needed for spasms.   methylPREDNISolone (MEDROL DOSEPAK) 4 MG TBPK tablet Take as package instructions.   [DISCONTINUED] fluticasone (FLONASE) 50 MCG/ACT nasal spray Place 2 sprays into both nostrils daily.     Allergies:   Patient has no known allergies.   ROS:   Please see the history of present illness. All other systems reviewed and are negative.   Labs/Other Tests and Data Reviewed:    Recent Labs: 11/22/2023: ALT 11; BUN 10; Creatinine, Ser 0.91; Hemoglobin 13.5; Hemoglobin 13.7; Platelets 203; Platelets 198; Potassium 3.8; Sodium 137   Recent Lipid Panel No results found for: "CHOL", "TRIG", "HDL", "CHOLHDL", "LDLCALC", "LDLDIRECT"  Wt Readings from Last 3 Encounters:  11/22/23 115 lb (52.2 kg)  07/19/23 113 lb (51.3 kg)  02/01/23 113 lb (51.3 kg)     Objective:    Vital Signs:  There were no vitals taken for this visit.   VITAL SIGNS:  reviewed GEN:  no acute distress EYES:  sclerae anicteric, EOMI - Extraocular Movements Intact RESPIRATORY:  normal respiratory effort, symmetric expansion NEURO:  alert and oriented x 3, no  obvious focal deficit PSYCH:  normal affect  ASSESSMENT & PLAN:    Allergic sinusitis Her symptoms are likely due to pollen allergy Medrol Dosepak Flonase prescribed Advised to start taking Zyrtec 10 mg once daily Advised to perform warm water gargling Sinus rinse and/or use of vaporizer for nasal congestion   I discussed the assessment and treatment plan with the patient. The patient was provided an opportunity to ask questions, and all were  answered. The patient agreed with the plan and demonstrated an understanding of the instructions.   The patient was advised to call back or seek an in-person evaluation if the symptoms worsen or if the condition fails to improve as anticipated.  The above assessment and management plan was discussed with the patient. The patient verbalized understanding of and has agreed to the management plan.   Medication Adjustments/Labs and Tests Ordered: Current medicines are reviewed at length with the patient today.  Concerns regarding medicines are outlined above.   Tests Ordered: No orders of the defined types were placed in this encounter.   Medication Changes: Meds ordered this encounter  Medications   methylPREDNISolone (MEDROL DOSEPAK) 4 MG TBPK tablet    Sig: Take as package instructions.    Dispense:  1 each    Refill:  0   fluticasone (FLONASE) 50 MCG/ACT nasal spray    Sig: Place 2 sprays into both nostrils daily.    Dispense:  16 g    Refill:  1     Note: This dictation was prepared with Dragon dictation along with smaller phrase technology. Similar sounding words can be transcribed inadequately or may not be corrected upon review. Any transcriptional errors that result from this process are unintentional.      Disposition:  Follow up  Signed, Meldon Sport, MD  03/23/2024 1:19 PM     [name]

## 2024-04-17 DIAGNOSIS — Z419 Encounter for procedure for purposes other than remedying health state, unspecified: Secondary | ICD-10-CM | POA: Diagnosis not present

## 2024-04-25 ENCOUNTER — Telehealth: Admitting: Physician Assistant

## 2024-04-25 DIAGNOSIS — R051 Acute cough: Secondary | ICD-10-CM

## 2024-04-25 MED ORDER — BENZONATATE 100 MG PO CAPS: 100.0000 mg | ORAL_CAPSULE | Freq: Three times a day (TID) | ORAL | 0 refills | Status: DC | PRN

## 2024-04-25 NOTE — Progress Notes (Signed)
 E visit for Flu like symptoms   We are sorry that you are not feeling well.  Here is how we plan to help! Based on what you have shared with me it looks like you may have flu-like symptoms that should be watched but do not seem to indicate anti-viral treatment.  Influenza or "the flu" is   an infection caused by a respiratory virus. The flu virus is highly contagious and persons who did not receive their yearly flu vaccination may "catch" the flu from close contact.  We have anti-viral medications to treat the viruses that cause this infection. They are not a "cure" and only shorten the course of the infection. These prescriptions are most effective when they are given within the first 2 days of "flu" symptoms. Antiviral medication are indicated if you have a high risk of complications from the flu. You should  also consider an antiviral medication if you are in close contact with someone who is at risk. These medications can help patients avoid complications from the flu  but have side effects that you should know. Possible side effects from Tamiflu or oseltamivir include nausea, vomiting, diarrhea, dizziness, headaches, eye redness, sleep problems or other respiratory symptoms. You should not take Tamiflu if you have an allergy to oseltamivir or any to the ingredients in Tamiflu.  Based upon your symptoms and potential risk factors I recommend that you follow the flu symptoms recommendation that I have listed below.  I will send in Tessalon  to take as needed for cough.   ANYONE WHO HAS FLU SYMPTOMS SHOULD: Stay home. The flu is highly contagious and going out or to work exposes others! Be sure to drink plenty of fluids. Water is fine as well as fruit juices, sodas and electrolyte beverages. You may want to stay away from caffeine or alcohol. If you are nauseated, try taking small sips of liquids. How do you know if you are getting enough fluid? Your urine should be a pale yellow or almost  colorless. Get rest. Taking a steamy shower or using a humidifier may help nasal congestion and ease sore throat pain. Using a saline nasal spray works much the same way. Cough drops, hard candies and sore throat lozenges may ease your cough. Line up a caregiver. Have someone check on you regularly.   GET HELP RIGHT AWAY IF: You cannot keep down liquids or your medications. You become short of breath Your fell like you are going to pass out or loose consciousness. Your symptoms persist after you have completed your treatment plan MAKE SURE YOU  Understand these instructions. Will watch your condition. Will get help right away if you are not doing well or get worse.  Your e-visit answers were reviewed by a board certified advanced clinical practitioner to complete your personal care plan.  Depending on the condition, your plan could have included both over the counter or prescription medications.  If there is a problem please reply  once you have received a response from your provider.  Your safety is important to us .  If you have drug allergies check your prescription carefully.    You can use MyChart to ask questions about today's visit, request a non-urgent call back, or ask for a work or school excuse for 24 hours related to this e-Visit. If it has been greater than 24 hours you will need to follow up with your provider, or enter a new e-Visit to address those concerns.  You will get an e-mail  in the next two days asking about your experience.  I hope that your e-visit has been valuable and will speed your recovery. Thank you for using e-visits.   I have spent 5 minutes in review of e-visit questionnaire, review and updating patient chart, medical decision making and response to patient.   Char Common Ward, PA-C

## 2024-04-27 ENCOUNTER — Ambulatory Visit (INDEPENDENT_AMBULATORY_CARE_PROVIDER_SITE_OTHER): Admitting: Internal Medicine

## 2024-04-27 VITALS — BP 90/66 | HR 112 | Ht 62.0 in | Wt 104.4 lb

## 2024-04-27 DIAGNOSIS — K2900 Acute gastritis without bleeding: Secondary | ICD-10-CM | POA: Diagnosis not present

## 2024-04-27 DIAGNOSIS — J069 Acute upper respiratory infection, unspecified: Secondary | ICD-10-CM | POA: Insufficient documentation

## 2024-04-27 DIAGNOSIS — R6889 Other general symptoms and signs: Secondary | ICD-10-CM

## 2024-04-27 MED ORDER — OMEPRAZOLE 20 MG PO CPDR
20.0000 mg | DELAYED_RELEASE_CAPSULE | Freq: Every day | ORAL | 0 refills | Status: AC
Start: 2024-04-27 — End: ?

## 2024-04-27 MED ORDER — AMOXICILLIN-POT CLAVULANATE 875-125 MG PO TABS: 1.0000 | ORAL_TABLET | Freq: Two times a day (BID) | ORAL | 0 refills | Status: DC

## 2024-04-27 MED ORDER — ONDANSETRON HCL 4 MG PO TABS: 4.0000 mg | ORAL_TABLET | Freq: Three times a day (TID) | ORAL | 0 refills | Status: AC | PRN

## 2024-04-27 NOTE — Patient Instructions (Signed)
 Please start taking Omeprazole for gastritis.  Please start taking Zofran  as needed for nausea.  Please maintain at least 64 ounces of fluid intake and start soft food for now.

## 2024-04-27 NOTE — Assessment & Plan Note (Signed)
 Has URTI symptoms for the last 5 days Has flulike symptoms - check flu, COVID and RSV tests Zofran  as needed for nausea Started empiric Augmentin  considering persistent symptoms Maintain adequate hydration and advance diet to semisolid as tolerated Advised to go to ER if unable to tolerate p.o. intake by tomorrow

## 2024-04-27 NOTE — Progress Notes (Signed)
 Acute Office Visit  Subjective:    Patient ID: Susan Lara, female    DOB: [birth year], 28 y.o.   MRN: [id number]  Chief Complaint  Patient presents with   Nasal Congestion    Having some nasal congestion, headache, diarrhea, throwing up. Started on Monday.     HPI Patient is in today for complaint of nasal congestion, postnasal drip, sinus pressure related headache, vomiting and diarrhea for the last 3 days.  She had nasal congestion 2 days prior to the onset of other symptoms.  She has not been able to eat solid food for the last 3 days, and is relying on liquids only.  She still has episodes of vomiting after trying liquid.  Denies any known sick contact.    Past Medical History:  Diagnosis Date   Candida vaginitis 07/08/2020   Medical history non-contributory    Normal labor 07/10/2020    Past Surgical History:  Procedure Laterality Date   ESOPHAGEAL MANOMETRY N/A 03/07/2018   Procedure: ESOPHAGEAL MANOMETRY (EM);  Surgeon: Alvis Jourdain, MD;  Location: [facility];  Service: Endoscopy;  Laterality: N/A;   SINUS EXPLORATION  2011    Family History  Problem Relation Age of Onset   Cancer Paternal Grandmother        breast   Cancer Maternal Grandfather        brain    Social History   Socioeconomic History   Marital status: Single    Spouse name: Not on file   Number of children: Not on file   Years of education: Not on file   Highest education level: Associate degree: occupational, Scientist, product/process development, or vocational program  Occupational History   Not on file  Tobacco Use   Smoking status: Never   Smokeless tobacco: Never  Vaping Use   Vaping status: Never Used  Substance and Sexual Activity   Alcohol use: Not Currently   Drug use: Never   Sexual activity: Yes    Birth control/protection: None  Other Topics Concern   Not on file  Social History Narrative   Not on file   Social Drivers of Health   Financial Resource Strain: Patient Declined (04/27/2024)    Overall Financial Resource Strain (CARDIA)    Difficulty of Paying Living Expenses: Patient declined  Food Insecurity: Patient Declined (04/27/2024)   Hunger Vital Sign    Worried About Running Out of Food in the Last Year: Patient declined    Ran Out of Food in the Last Year: Patient declined  Transportation Needs: Patient Declined (04/27/2024)   PRAPARE - Administrator, Civil Service (Medical): Patient declined    Lack of Transportation (Non-Medical): Patient declined  Physical Activity: Insufficiently Active (04/27/2024)   Exercise Vital Sign    Days of Exercise per Week: 2 days    Minutes of Exercise per Session: 10 min  Stress: Patient Declined (04/27/2024)   [employer] of Occupational Health - Occupational Stress Questionnaire    Feeling of Stress : Patient declined  Social Connections: Unknown (04/27/2024)   Social Connection and Isolation Panel [NHANES]    Frequency of Communication with Friends and Family: More than three times a week    Frequency of Social Gatherings with Friends and Family: More than three times a week    Attends Religious Services: Patient declined    Database administrator or Organizations: No    Attends Banker Meetings: 1 to 4 times per year    Marital Status: Patient  declined  Intimate Partner Violence: Not At Risk (04/17/2020)   Humiliation, Afraid, Rape, and Kick questionnaire    Fear of Current or Ex-Partner: No    Emotionally Abused: No    Physically Abused: No    Sexually Abused: No    Outpatient Medications Prior to Visit  Medication Sig Dispense Refill   acetaminophen  (TYLENOL ) 325 MG tablet Take 2 tablets (650 mg total) by mouth every 6 (six) hours as needed. 36 tablet 0   benzonatate  (TESSALON ) 100 MG capsule Take 1 capsule (100 mg total) by mouth 3 (three) times daily as needed. 20 capsule 0   cetirizine  (ZYRTEC ) 10 MG tablet Take 1 tablet (10 mg total) by mouth daily. 30 tablet 0   dicyclomine  (BENTYL ) 20 MG  tablet Take 1 tablet (20 mg total) by mouth every 6 (six) hours as needed for spasms. 30 tablet 0   fluticasone  (FLONASE ) 50 MCG/ACT nasal spray Place 2 sprays into both nostrils daily. 16 g 1   benzonatate  (TESSALON ) 100 MG capsule Take 1-2 capsules (100-200 mg total) by mouth 3 (three) times daily as needed. 30 capsule 0   methylPREDNISolone  (MEDROL  DOSEPAK) 4 MG TBPK tablet Take as package instructions. 1 each 0   No facility-administered medications prior to visit.    No Known Allergies  Review of Systems  Constitutional:  Positive for appetite change, chills, fatigue and fever.  HENT:  Positive for congestion and sinus pressure.   Eyes:  Negative for pain and discharge.  Respiratory:  Positive for cough. Negative for shortness of breath.   Cardiovascular:  Negative for chest pain and palpitations.  Gastrointestinal:  Positive for abdominal pain, diarrhea, nausea and vomiting.  Endocrine: Negative for polydipsia and polyuria.  Genitourinary:  Negative for dysuria and hematuria.  Musculoskeletal:  Negative for neck pain and neck stiffness.  Skin:  Negative for rash.  Neurological:  Positive for dizziness and weakness.  Psychiatric/Behavioral:  Negative for agitation and behavioral problems.        Objective:     Physical Exam Vitals reviewed.  Constitutional:      General: She is not in acute distress.    Appearance: She is not diaphoretic.  HENT:     Head: Normocephalic and atraumatic.     Nose: Congestion present.     Mouth/Throat:     Mouth: Mucous membranes are dry.     Pharynx: Posterior oropharyngeal erythema present.  Eyes:     General: No scleral icterus.    Extraocular Movements: Extraocular movements intact.  Cardiovascular:     Rate and Rhythm: Regular rhythm. Tachycardia present.     Heart sounds: Normal heart sounds. No murmur heard. Pulmonary:     Breath sounds: Normal breath sounds. No wheezing or rales.  Abdominal:     Palpations: Abdomen is soft.      Tenderness: There is abdominal tenderness (Epigastric).  Musculoskeletal:     Cervical back: Neck supple. No tenderness.     Right lower leg: No edema.     Left lower leg: No edema.  Skin:    General: Skin is warm.     Findings: No rash.  Neurological:     General: No focal deficit present.     Mental Status: She is alert and oriented to person, place, and time.  Psychiatric:        Mood and Affect: Mood normal.        Behavior: Behavior normal.     BP 90/66   Pulse (!) 112  Ht 5\' 2"  (1.575 m)   Wt 104 lb 6.4 oz (47.4 kg)   SpO2 97%   BMI 19.10 kg/m  Wt Readings from Last 3 Encounters:  04/27/24 104 lb 6.4 oz (47.4 kg)  11/22/23 115 lb (52.2 kg)  07/19/23 113 lb (51.3 kg)        Assessment & Plan:   Problem List Items Addressed This Visit       Respiratory   URTI (acute upper respiratory infection) - Primary   Has URTI symptoms for the last 5 days Has flulike symptoms - check flu, COVID and RSV tests Zofran  as needed for nausea Started empiric Augmentin  considering persistent symptoms Maintain adequate hydration and advance diet to semisolid as tolerated Advised to go to ER if unable to tolerate p.o. intake by tomorrow       Relevant Medications   amoxicillin -clavulanate (AUGMENTIN ) 875-125 MG tablet     Digestive   Acute gastritis without hemorrhage   Started omeprazole 20 mg QD for gastritis, has nausea and vomiting leading to inability to eat for the last 3 days Zofran  as needed for nausea      Relevant Medications   ondansetron  (ZOFRAN ) 4 MG tablet   omeprazole (PRILOSEC) 20 MG capsule   Other Visit Diagnoses       Flu-like symptoms       Relevant Orders   COVID-19, Flu A+B and RSV        Meds ordered this encounter  Medications   ondansetron  (ZOFRAN ) 4 MG tablet    Sig: Take 1 tablet (4 mg total) by mouth every 8 (eight) hours as needed for nausea or vomiting.    Dispense:  20 tablet    Refill:  0   omeprazole (PRILOSEC) 20 MG  capsule    Sig: Take 1 capsule (20 mg total) by mouth daily.    Dispense:  30 capsule    Refill:  0   amoxicillin -clavulanate (AUGMENTIN ) 875-125 MG tablet    Sig: Take 1 tablet by mouth 2 (two) times daily.    Dispense:  14 tablet    Refill:  0     Shala Baumbach Alyssa Backbone, MD

## 2024-04-27 NOTE — Assessment & Plan Note (Signed)
 Started omeprazole 20 mg QD for gastritis, has nausea and vomiting leading to inability to eat for the last 3 days Zofran  as needed for nausea

## 2024-04-29 ENCOUNTER — Ambulatory Visit: Payer: Self-pay | Admitting: Internal Medicine

## 2024-04-29 LAB — COVID-19, FLU A+B AND RSV
Influenza A, NAA: NOT DETECTED
Influenza B, NAA: NOT DETECTED
RSV, NAA: NOT DETECTED
SARS-CoV-2, NAA: NOT DETECTED

## 2024-05-18 DIAGNOSIS — Z419 Encounter for procedure for purposes other than remedying health state, unspecified: Secondary | ICD-10-CM | POA: Diagnosis not present

## 2024-05-19 ENCOUNTER — Other Ambulatory Visit: Payer: Self-pay | Admitting: Internal Medicine

## 2024-05-19 DIAGNOSIS — K2900 Acute gastritis without bleeding: Secondary | ICD-10-CM

## 2024-06-06 ENCOUNTER — Ambulatory Visit (INDEPENDENT_AMBULATORY_CARE_PROVIDER_SITE_OTHER): Admitting: Internal Medicine

## 2024-06-06 ENCOUNTER — Encounter: Payer: Self-pay | Admitting: Internal Medicine

## 2024-06-06 VITALS — BP 95/56 | HR 66 | Ht 62.0 in | Wt 106.0 lb

## 2024-06-06 DIAGNOSIS — H6121 Impacted cerumen, right ear: Secondary | ICD-10-CM | POA: Insufficient documentation

## 2024-06-06 DIAGNOSIS — J309 Allergic rhinitis, unspecified: Secondary | ICD-10-CM | POA: Diagnosis not present

## 2024-06-06 DIAGNOSIS — J069 Acute upper respiratory infection, unspecified: Secondary | ICD-10-CM

## 2024-06-06 MED ORDER — AZITHROMYCIN 250 MG PO TABS: ORAL_TABLET | ORAL | 0 refills | Status: AC

## 2024-06-06 MED ORDER — FLUTICASONE PROPIONATE 50 MCG/ACT NA SUSP
2.0000 | Freq: Every day | NASAL | 1 refills | Status: DC
Start: 2024-06-06 — End: 2024-07-26

## 2024-06-06 NOTE — Assessment & Plan Note (Signed)
 Has URTI symptoms for the last 2 weeks Started empiric azithromycin  considering persistent symptoms Maintain adequate hydration

## 2024-06-06 NOTE — Assessment & Plan Note (Signed)
 Right ear irrigation done today, she tolerated the procedure well Advised to avoid using Q-tip excessively

## 2024-06-06 NOTE — Patient Instructions (Signed)
 Please start taking Azithromycin  as prescribed.  Please start using Flonase  for nasal congestion/allergies.

## 2024-06-06 NOTE — Progress Notes (Signed)
 Acute Office Visit  Subjective:    Patient ID: Susan Lara, female    DOB: [birth year], 28 y.o.   MRN: [id number]  Chief Complaint  Patient presents with   Tinnitus    Pt reports sx of right ear ringing, making whirling sound. Ongoing for 2 weeks.     HPI Patient is in today for complaint of right ear fullness, whirling sounds in the right ear and right sided facial pain for the last 2 weeks.  She also reports nasal congestion, postnasal drip and sinus pressure related headache.  She has a history of allergic sinusitis, has run out of Flonase .  Denies fever, chills.  Denies dyspnea or wheezing currently.  Past Medical History:  Diagnosis Date   Candida vaginitis 07/08/2020   Medical history non-contributory    Normal labor 07/10/2020    Past Surgical History:  Procedure Laterality Date   ESOPHAGEAL MANOMETRY N/A 03/07/2018   Procedure: ESOPHAGEAL MANOMETRY (EM);  Surgeon: Rollin Dover, MD;  Location: [facility];  Service: Endoscopy;  Laterality: N/A;   SINUS EXPLORATION  2011    Family History  Problem Relation Age of Onset   Cancer Paternal Grandmother        breast   Cancer Maternal Grandfather        brain    Social History   Socioeconomic History   Marital status: Single    Spouse name: Not on file   Number of children: Not on file   Years of education: Not on file   Highest education level: Associate degree: occupational, Scientist, product/process development, or vocational program  Occupational History   Not on file  Tobacco Use   Smoking status: Never   Smokeless tobacco: Never  Vaping Use   Vaping status: Never Used  Substance and Sexual Activity   Alcohol use: Not Currently   Drug use: Never   Sexual activity: Yes    Birth control/protection: None  Other Topics Concern   Not on file  Social History Narrative   Not on file   Social Drivers of Health   Financial Resource Strain: Patient Declined (04/27/2024)   Overall Financial Resource Strain (CARDIA)    Difficulty  of Paying Living Expenses: Patient declined  Food Insecurity: Patient Declined (04/27/2024)   Hunger Vital Sign    Worried About Running Out of Food in the Last Year: Patient declined    Ran Out of Food in the Last Year: Patient declined  Transportation Needs: Patient Declined (04/27/2024)   PRAPARE - Administrator, Civil Service (Medical): Patient declined    Lack of Transportation (Non-Medical): Patient declined  Physical Activity: Insufficiently Active (04/27/2024)   Exercise Vital Sign    Days of Exercise per Week: 2 days    Minutes of Exercise per Session: 10 min  Stress: Patient Declined (04/27/2024)   [employer] of Occupational Health - Occupational Stress Questionnaire    Feeling of Stress : Patient declined  Social Connections: Unknown (04/27/2024)   Social Connection and Isolation Panel    Frequency of Communication with Friends and Family: More than three times a week    Frequency of Social Gatherings with Friends and Family: More than three times a week    Attends Religious Services: Patient declined    Active Member of Clubs or Organizations: No    Attends Banker Meetings: 1 to 4 times per year    Marital Status: Patient declined  Intimate Partner Violence: Not At Risk (04/17/2020)   Humiliation, Afraid,  Rape, and Kick questionnaire    Fear of Current or Ex-Partner: No    Emotionally Abused: No    Physically Abused: No    Sexually Abused: No    Outpatient Medications Prior to Visit  Medication Sig Dispense Refill   acetaminophen  (TYLENOL ) 325 MG tablet Take 2 tablets (650 mg total) by mouth every 6 (six) hours as needed. 36 tablet 0   cetirizine  (ZYRTEC ) 10 MG tablet Take 1 tablet (10 mg total) by mouth daily. 30 tablet 0   dicyclomine  (BENTYL ) 20 MG tablet Take 1 tablet (20 mg total) by mouth every 6 (six) hours as needed for spasms. 30 tablet 0   omeprazole  (PRILOSEC) 20 MG capsule TAKE 1 CAPSULE BY MOUTH EVERY DAY 90 capsule 1    ondansetron  (ZOFRAN ) 4 MG tablet Take 1 tablet (4 mg total) by mouth every 8 (eight) hours as needed for nausea or vomiting. 20 tablet 0   amoxicillin -clavulanate (AUGMENTIN ) 875-125 MG tablet Take 1 tablet by mouth 2 (two) times daily. 14 tablet 0   benzonatate  (TESSALON ) 100 MG capsule Take 1 capsule (100 mg total) by mouth 3 (three) times daily as needed. 20 capsule 0   fluticasone  (FLONASE ) 50 MCG/ACT nasal spray Place 2 sprays into both nostrils daily. 16 g 1   No facility-administered medications prior to visit.    No Known Allergies  Review of Systems  Constitutional:  Positive for chills and fever.  HENT:  Positive for congestion and sinus pressure.        Right ear fullness  Eyes:  Negative for pain and discharge.  Respiratory:  Negative for cough and shortness of breath.   Cardiovascular:  Negative for chest pain and palpitations.  Gastrointestinal:  Positive for abdominal pain, diarrhea, nausea and vomiting.  Endocrine: Negative for polydipsia and polyuria.  Genitourinary:  Negative for dysuria and hematuria.  Musculoskeletal:  Negative for neck pain and neck stiffness.  Skin:  Negative for rash.  Neurological:  Negative for dizziness and weakness.  Psychiatric/Behavioral:  Negative for agitation and behavioral problems.        Objective:    Physical Exam Vitals reviewed.  Constitutional:      General: She is not in acute distress.    Appearance: She is not diaphoretic.  HENT:     Head: Normocephalic and atraumatic.     Right Ear: External ear normal. There is impacted cerumen.     Left Ear: External ear normal.     Nose: Congestion present.     Right Sinus: Maxillary sinus tenderness present.     Mouth/Throat:     Mouth: Mucous membranes are dry.     Pharynx: Posterior oropharyngeal erythema present.   Eyes:     General: No scleral icterus.    Extraocular Movements: Extraocular movements intact.    Cardiovascular:     Rate and Rhythm: Normal rate and  regular rhythm.     Heart sounds: Normal heart sounds. No murmur heard. Pulmonary:     Breath sounds: Normal breath sounds. No wheezing or rales.   Musculoskeletal:     Cervical back: Neck supple. No tenderness.     Right lower leg: No edema.     Left lower leg: No edema.   Skin:    General: Skin is warm.     Findings: No rash.   Neurological:     General: No focal deficit present.     Mental Status: She is alert and oriented to person, place, and time.  Psychiatric:        Mood and Affect: Mood normal.        Behavior: Behavior normal.     BP (!) 95/56   Pulse 66   Ht 5' 2 (1.575 m)   Wt 106 lb (48.1 kg)   SpO2 97%   BMI 19.39 kg/m  Wt Readings from Last 3 Encounters:  06/06/24 106 lb (48.1 kg)  04/27/24 104 lb 6.4 oz (47.4 kg)  11/22/23 115 lb (52.2 kg)        Assessment & Plan:   Problem List Items Addressed This Visit       Respiratory   Allergic sinusitis   Her symptoms are likely due to an environmental allergy Flonase  prescribed Advised to start taking Zyrtec  10 mg once daily Advised to perform warm water gargling Sinus rinse and/or use of vaporizer for nasal congestion      Relevant Medications   fluticasone  (FLONASE ) 50 MCG/ACT nasal spray   azithromycin  (ZITHROMAX ) 250 MG tablet   URTI (acute upper respiratory infection) - Primary   Has URTI symptoms for the last 2 weeks Started empiric azithromycin  considering persistent symptoms Maintain adequate hydration       Relevant Medications   azithromycin  (ZITHROMAX ) 250 MG tablet     Nervous and Auditory   Impacted cerumen of right ear   Right ear irrigation done today, she tolerated the procedure well Advised to avoid using Q-tip excessively        Meds ordered this encounter  Medications   fluticasone  (FLONASE ) 50 MCG/ACT nasal spray    Sig: Place 2 sprays into both nostrils daily.    Dispense:  16 g    Refill:  1   azithromycin  (ZITHROMAX ) 250 MG tablet    Sig: Take 2 tablets  on day 1, then 1 tablet daily on days 2 through 5    Dispense:  6 tablet    Refill:  0     Kaydyn Chism MARLA Blanch, MD

## 2024-06-06 NOTE — Assessment & Plan Note (Signed)
 Her symptoms are likely due to an environmental allergy Flonase  prescribed Advised to start taking Zyrtec  10 mg once daily Advised to perform warm water gargling Sinus rinse and/or use of vaporizer for nasal congestion

## 2024-06-14 ENCOUNTER — Ambulatory Visit

## 2024-06-17 DIAGNOSIS — Z419 Encounter for procedure for purposes other than remedying health state, unspecified: Secondary | ICD-10-CM | POA: Diagnosis not present

## 2024-07-18 DIAGNOSIS — Z419 Encounter for procedure for purposes other than remedying health state, unspecified: Secondary | ICD-10-CM | POA: Diagnosis not present

## 2024-07-26 ENCOUNTER — Encounter (INDEPENDENT_AMBULATORY_CARE_PROVIDER_SITE_OTHER): Payer: Self-pay

## 2024-07-26 ENCOUNTER — Encounter: Payer: Self-pay | Admitting: Internal Medicine

## 2024-07-26 ENCOUNTER — Telehealth (INDEPENDENT_AMBULATORY_CARE_PROVIDER_SITE_OTHER): Admitting: Internal Medicine

## 2024-07-26 DIAGNOSIS — J0111 Acute recurrent frontal sinusitis: Secondary | ICD-10-CM | POA: Diagnosis not present

## 2024-07-26 DIAGNOSIS — J309 Allergic rhinitis, unspecified: Secondary | ICD-10-CM | POA: Diagnosis not present

## 2024-07-26 MED ORDER — LEVOCETIRIZINE DIHYDROCHLORIDE 5 MG PO TABS: 5.0000 mg | ORAL_TABLET | Freq: Every evening | ORAL | 5 refills | Status: AC

## 2024-07-26 MED ORDER — FLUTICASONE PROPIONATE 50 MCG/ACT NA SUSP: 2.0000 | Freq: Every day | NASAL | 1 refills | Status: AC

## 2024-07-26 MED ORDER — AMOXICILLIN-POT CLAVULANATE 875-125 MG PO TABS
1.0000 | ORAL_TABLET | Freq: Two times a day (BID) | ORAL | 0 refills | Status: DC
Start: 2024-07-26 — End: 2024-08-14

## 2024-07-26 NOTE — Assessment & Plan Note (Signed)
 Has persistent symptoms despite symptomatic treatment Started empiric Augmentin  Promethazine -DM syrup as needed for cough Maintain adequate hydration Advised to perform warm water gargling and use phenol throat spray as needed for sore throat Referred to ENT specialist due to recurrent sinusitis

## 2024-07-26 NOTE — Assessment & Plan Note (Signed)
 Her recurrent symptoms are likely due to an environmental allergy Flonase  refilled, needs to use it regularly Prescribed Xyzal  5 mg QD Advised to perform warm water gargling Sinus rinse and/or use of vaporizer for nasal congestion

## 2024-07-26 NOTE — Progress Notes (Signed)
 Virtual Visit via Video Note   Because of Susan Lara's co-morbid illnesses, she is at least at moderate risk for complications without adequate follow up.  This format is felt to be most appropriate for this patient at this time.  All issues noted in this document were discussed and addressed.  A limited physical exam was performed with this format.      Evaluation Performed:  Follow-up visit  Date:  07/26/2024   ID:  Susan Lara, Susan Lara [birth year], MRN [id number]  Patient Location: Home Provider Location: Office/Clinic  Participants: Patient Location of Patient: Home Location of Provider: Telehealth Consent was obtain for visit to be over via telehealth. I verified that I am speaking with the correct person using two identifiers.  PCP:  Tobie Suzzane MARLA, MD   Chief Complaint: Nasal congestion  History of Present Illness:    Susan Lara is a 28 y.o. female who has a video visit for complaint of nasal congestion, sinus pressure related headache and left-sided facial pain, dry throat and cough for the last 10 days.  She denies any fever, chills, dyspnea or wheezing currently.  She has history of recurrent sinusitis.  She has tried taking OTC decongestant without much relief.  The patient does not have symptoms concerning for COVID-19 infection (fever, chills, cough, or new shortness of breath).   Past Medical, Surgical, Social History, Allergies, and Medications have been Reviewed.  Past Medical History:  Diagnosis Date   Candida vaginitis 07/08/2020   Medical history non-contributory    Normal labor 07/10/2020   Past Surgical History:  Procedure Laterality Date   ESOPHAGEAL MANOMETRY N/A 03/07/2018   Procedure: ESOPHAGEAL MANOMETRY (EM);  Surgeon: Rollin Dover, MD;  Location: [facility];  Service: Endoscopy;  Laterality: N/A;   SINUS EXPLORATION  2011     Current Meds  Medication Sig   acetaminophen  (TYLENOL ) 325 MG tablet Take 2 tablets (650 mg  total) by mouth every 6 (six) hours as needed.   amoxicillin -clavulanate (AUGMENTIN ) 875-125 MG tablet Take 1 tablet by mouth 2 (two) times daily.   dicyclomine  (BENTYL ) 20 MG tablet Take 1 tablet (20 mg total) by mouth every 6 (six) hours as needed for spasms.   levocetirizine (XYZAL ) 5 MG tablet Take 1 tablet (5 mg total) by mouth every evening.   omeprazole  (PRILOSEC) 20 MG capsule TAKE 1 CAPSULE BY MOUTH EVERY DAY   ondansetron  (ZOFRAN ) 4 MG tablet Take 1 tablet (4 mg total) by mouth every 8 (eight) hours as needed for nausea or vomiting.   [DISCONTINUED] cetirizine  (ZYRTEC ) 10 MG tablet Take 1 tablet (10 mg total) by mouth daily.   [DISCONTINUED] fluticasone  (FLONASE ) 50 MCG/ACT nasal spray Place 2 sprays into both nostrils daily.     Allergies:   Patient has no known allergies.   ROS:   Please see the history of present illness. All other systems reviewed and are negative.   Labs/Other Tests and Data Reviewed:    Recent Labs: 11/22/2023: ALT 11; BUN 10; Creatinine, Ser 0.91; Hemoglobin 13.5; Hemoglobin 13.7; Platelets 203; Platelets 198; Potassium 3.8; Sodium 137   Recent Lipid Panel No results found for: CHOL, TRIG, HDL, CHOLHDL, LDLCALC, LDLDIRECT  Wt Readings from Last 3 Encounters:  06/06/24 106 lb (48.1 kg)  04/27/24 104 lb 6.4 oz (47.4 kg)  11/22/23 115 lb (52.2 kg)     Objective:    Vital Signs:  There were no vitals taken for this visit.   VITAL SIGNS:  reviewed GEN:  no acute distress EYES:  sclerae anicteric, EOMI - Extraocular Movements Intact RESPIRATORY:  normal respiratory effort, symmetric expansion NEURO:  alert and oriented x 3, no obvious focal deficit PSYCH:  normal affect  ASSESSMENT & PLAN:    Acute recurrent frontal sinusitis Has persistent symptoms despite symptomatic treatment Started empiric Augmentin  Promethazine -DM syrup as needed for cough Maintain adequate hydration Advised to perform warm water gargling and use phenol  throat spray as needed for sore throat Referred to ENT specialist due to recurrent sinusitis  Allergic sinusitis Her recurrent symptoms are likely due to an environmental allergy Flonase  refilled, needs to use it regularly Prescribed Xyzal  5 mg QD Advised to perform warm water gargling Sinus rinse and/or use of vaporizer for nasal congestion    I discussed the assessment and treatment plan with the patient. The patient was provided an opportunity to ask questions, and all were answered. The patient agreed with the plan and demonstrated an understanding of the instructions.   The patient was advised to call back or seek an in-person evaluation if the symptoms worsen or if the condition fails to improve as anticipated.  The above assessment and management plan was discussed with the patient. The patient verbalized understanding of and has agreed to the management plan.   Medication Adjustments/Labs and Tests Ordered: Current medicines are reviewed at length with the patient today.  Concerns regarding medicines are outlined above.   Tests Ordered: Orders Placed This Encounter  Procedures   Ambulatory referral to ENT    Medication Changes: Meds ordered this encounter  Medications   amoxicillin -clavulanate (AUGMENTIN ) 875-125 MG tablet    Sig: Take 1 tablet by mouth 2 (two) times daily.    Dispense:  14 tablet    Refill:  0   levocetirizine (XYZAL ) 5 MG tablet    Sig: Take 1 tablet (5 mg total) by mouth every evening.    Dispense:  30 tablet    Refill:  5   fluticasone  (FLONASE ) 50 MCG/ACT nasal spray    Sig: Place 2 sprays into both nostrils daily.    Dispense:  16 g    Refill:  1     Note: This dictation was prepared with Dragon dictation along with smaller phrase technology. Similar sounding words can be transcribed inadequately or may not be corrected upon review. Any transcriptional errors that result from this process are unintentional.      Disposition:  Follow up   Signed, Suzzane MARLA Blanch, MD  07/26/2024 2:49 PM     [facility]

## 2024-08-14 ENCOUNTER — Encounter: Payer: Self-pay | Admitting: Internal Medicine

## 2024-08-14 ENCOUNTER — Telehealth (INDEPENDENT_AMBULATORY_CARE_PROVIDER_SITE_OTHER): Admitting: Internal Medicine

## 2024-08-14 ENCOUNTER — Ambulatory Visit: Admitting: Internal Medicine

## 2024-08-14 DIAGNOSIS — R1032 Left lower quadrant pain: Secondary | ICD-10-CM

## 2024-08-14 DIAGNOSIS — K2950 Unspecified chronic gastritis without bleeding: Secondary | ICD-10-CM | POA: Insufficient documentation

## 2024-08-14 MED ORDER — OMEPRAZOLE 20 MG PO CPDR: 20.0000 mg | DELAYED_RELEASE_CAPSULE | Freq: Two times a day (BID) | ORAL | 1 refills | Status: DC

## 2024-08-14 MED ORDER — DICYCLOMINE HCL 20 MG PO TABS: 20.0000 mg | ORAL_TABLET | Freq: Four times a day (QID) | ORAL | 0 refills | Status: DC | PRN

## 2024-08-14 NOTE — Patient Instructions (Signed)
 Please start taking omeprazole  20 mg twice daily.  Please take dicyclomine  as needed for abdominal cramping.  Please keep food diary to associate symptoms with certain food products.  You are being referred to GI for further evaluation.

## 2024-08-14 NOTE — Assessment & Plan Note (Signed)
 Has chronic, intermittent LLQ abdominal pain, worse after eating, associated with abdominal cramping Prescribed Bentyl  as needed for abdominal cramps Referred to GI for further evaluation

## 2024-08-14 NOTE — Progress Notes (Signed)
 Virtual Visit via Video Note   Because of Susan Lara's co-morbid illnesses, she is at least at moderate risk for complications without adequate follow up.  This format is felt to be most appropriate for this patient at this time.  All issues noted in this document were discussed and addressed.  A limited physical exam was performed with this format.      Evaluation Performed:  Follow-up visit  Date:  08/14/2024   ID:  Susan Lara, DOB Mar 16, 1996, MRN [id number]  Patient Location: Home/Car Provider Location: Office/Clinic  Participants: Patient Location of Patient: Home Location of Provider: Telehealth Consent was obtain for visit to be over via telehealth. I verified that I am speaking with the correct person using two identifiers.  PCP:  Susan Susan MARLA, MD   Chief Complaint: Epigastric and LLQ abdominal pain  History of Present Illness:    Susan Lara is a 28 y.o. female who has a video visit for complaint of epigastric and LLQ abdominal pain for the last 4 weeks.  She has had similar abdominal pain in the past as well.  Her pain is intermittent, sharp, worse after eating and lasts for about 15 to 30 minutes.  She also reports abdominal cramping at times.  She has noticed mild improvement in the pain after defecation at times.  Denies any nausea or vomiting currently.  Denies melena or hematochezia currently.  She takes omeprazole  20 mg as needed for acid reflux currently.  The patient does not have symptoms concerning for COVID-19 infection (fever, chills, cough, or new shortness of breath).   Past Medical, Surgical, Social History, Allergies, and Medications have been Reviewed.  Past Medical History:  Diagnosis Date   Candida vaginitis 07/08/2020   Medical history non-contributory    Normal labor 07/10/2020   Past Surgical History:  Procedure Laterality Date   ESOPHAGEAL MANOMETRY N/A 03/07/2018   Procedure: ESOPHAGEAL MANOMETRY (EM);  Surgeon: Rollin Dover, MD;  Location: [facility];  Service: Endoscopy;  Laterality: N/A;   SINUS EXPLORATION  2011     Current Meds  Medication Sig   acetaminophen  (TYLENOL ) 325 MG tablet Take 2 tablets (650 mg total) by mouth every 6 (six) hours as needed.   fluticasone  (FLONASE ) 50 MCG/ACT nasal spray Place 2 sprays into both nostrils daily.   levocetirizine (XYZAL ) 5 MG tablet Take 1 tablet (5 mg total) by mouth every evening.   ondansetron  (ZOFRAN ) 4 MG tablet Take 1 tablet (4 mg total) by mouth every 8 (eight) hours as needed for nausea or vomiting.   [DISCONTINUED] amoxicillin -clavulanate (AUGMENTIN ) 875-125 MG tablet Take 1 tablet by mouth 2 (two) times daily.   [DISCONTINUED] dicyclomine  (BENTYL ) 20 MG tablet Take 1 tablet (20 mg total) by mouth every 6 (six) hours as needed for spasms.   [DISCONTINUED] omeprazole  (PRILOSEC) 20 MG capsule TAKE 1 CAPSULE BY MOUTH EVERY DAY     Allergies:   Patient has no known allergies.   ROS:   Please see the history of present illness. All other systems reviewed and are negative.   Labs/Other Tests and Data Reviewed:    Recent Labs: 11/22/2023: ALT 11; BUN 10; Creatinine, Ser 0.91; Hemoglobin 13.5; Hemoglobin 13.7; Platelets 203; Platelets 198; Potassium 3.8; Sodium 137   Recent Lipid Panel No results found for: CHOL, TRIG, HDL, CHOLHDL, LDLCALC, LDLDIRECT  Wt Readings from Last 3 Encounters:  08/14/24 112 lb (50.8 kg)  06/06/24 106 lb (48.1 kg)  04/27/24 104 lb 6.4  oz (47.4 kg)     Objective:    Vital Signs:  Ht 5' 3 (1.6 m)   Wt 112 lb (50.8 kg)   BMI 19.84 kg/m    VITAL SIGNS:  reviewed GEN:  no acute distress EYES:  sclerae anicteric, EOMI - Extraocular Movements Intact RESPIRATORY:  normal respiratory effort, symmetric expansion NEURO:  alert and oriented x 3, no obvious focal deficit PSYCH:  normal affect  ASSESSMENT & PLAN:    Chronic gastritis without bleeding Has chronic epigastric pain, likely related to  chronic gastritis/GERD Increase dose of omeprazole  to 20 mg BID for now Referred to GI for further evaluation  LLQ abdominal pain Has chronic, intermittent LLQ abdominal pain, worse after eating, associated with abdominal cramping Prescribed Bentyl  as needed for abdominal cramps Referred to GI for further evaluation    I discussed the assessment and treatment plan with the patient. The patient was provided an opportunity to ask questions, and all were answered. The patient agreed with the plan and demonstrated an understanding of the instructions.   The patient was advised to call back or seek an in-person evaluation if the symptoms worsen or if the condition fails to improve as anticipated.  The above assessment and management plan was discussed with the patient. The patient verbalized understanding of and has agreed to the management plan.   Medication Adjustments/Labs and Tests Ordered: Current medicines are reviewed at length with the patient today.  Concerns regarding medicines are outlined above.   Tests Ordered: Orders Placed This Encounter  Procedures   Ambulatory referral to Gastroenterology    Medication Changes: Meds ordered this encounter  Medications   omeprazole  (PRILOSEC) 20 MG capsule    Sig: Take 1 capsule (20 mg total) by mouth 2 (two) times daily before a meal.    Dispense:  180 capsule    Refill:  1   dicyclomine  (BENTYL ) 20 MG tablet    Sig: Take 1 tablet (20 mg total) by mouth every 6 (six) hours as needed for spasms.    Dispense:  30 tablet    Refill:  0     Note: This dictation was prepared with Dragon dictation along with smaller phrase technology. Similar sounding words can be transcribed inadequately or may not be corrected upon review. Any transcriptional errors that result from this process are unintentional.      Disposition:  Follow up  Signed, Susan Lara Blanch, MD  08/14/2024 2:35 PM     [facility]

## 2024-08-14 NOTE — Assessment & Plan Note (Signed)
 Has chronic epigastric pain, likely related to chronic gastritis/GERD Increase dose of omeprazole  to 20 mg BID for now Referred to GI for further evaluation

## 2024-08-15 ENCOUNTER — Encounter (INDEPENDENT_AMBULATORY_CARE_PROVIDER_SITE_OTHER): Payer: Self-pay | Admitting: *Deleted

## 2024-08-18 DIAGNOSIS — Z419 Encounter for procedure for purposes other than remedying health state, unspecified: Secondary | ICD-10-CM | POA: Diagnosis not present

## 2024-08-28 ENCOUNTER — Ambulatory Visit (INDEPENDENT_AMBULATORY_CARE_PROVIDER_SITE_OTHER): Admitting: Gastroenterology

## 2024-08-28 ENCOUNTER — Encounter (INDEPENDENT_AMBULATORY_CARE_PROVIDER_SITE_OTHER): Payer: Self-pay | Admitting: Gastroenterology

## 2024-08-28 VITALS — BP 88/56 | HR 69 | Temp 98.0°F | Ht 63.0 in | Wt 109.9 lb

## 2024-08-28 DIAGNOSIS — R1319 Other dysphagia: Secondary | ICD-10-CM

## 2024-08-28 DIAGNOSIS — R1013 Epigastric pain: Secondary | ICD-10-CM

## 2024-08-28 DIAGNOSIS — R109 Unspecified abdominal pain: Secondary | ICD-10-CM | POA: Diagnosis not present

## 2024-08-28 DIAGNOSIS — I950 Idiopathic hypotension: Secondary | ICD-10-CM | POA: Insufficient documentation

## 2024-08-28 NOTE — Patient Instructions (Signed)
 It was very nice to meet you today, as dicussed with will plan for the following :  1) upper endoscopy  2) labs and stool sample  3) Ultrasound and HIDA scan

## 2024-08-28 NOTE — Progress Notes (Signed)
 Margean Korell Faizan Felton Buczynski , M.D. Gastroenterology & Hepatology [facility] Gastroenterology [address] [place], [place] [address] Primary Care Physician: Tobie Suzzane POUR, MD [address] [place] [address]  Chief Complaint: Chronic progressive abdominal pain  History of Present Illness: Susan Lara is a 28 y.o. female with no significant medical comorbidities who presents for evaluation of Chronic progressive abdominal pain  Patient reports that for at least past 2 years she has abdominal pain located epigastric area worsens postprandially does not matter what type of food she eats solid or liquids epigastric pain radiating to the left side of her abdomen.  This has worsened recently and will keep her awake at night especially if she eats at night.  Patient developed fear of food at this time Patient was seen by PCP and PPI was optimized by increasing to twice daily.  Patient was also given Bentyl .  Patient had at least 2 CTs done for similar complaints of abdominal pain The patient denies having any nausea, vomiting, fever, chills, hematochezia, melena, hematemesis,  diarrhea, jaundice, pruritus or weight loss.  2018 patient had dysphagia where she reportedly underwent upper endoscopy followed by manometry which was negative.  Patient reported dysphagia resolved by itself and she has no difficulty swallowing for any   Labs 12/18/2022 normal liver enzymes hemoglobin 13.7 platelet 198  Last EGD:2018 ; no report avaible Last Colonoscopy:none  Esophageal manometry 2019 reportedly normal  FHx: neg for any gastrointestinal/liver disease, no malignancies Social: neg smoking, alcohol or illicit drug use Surgical: no abdominal surgeries  Past Medical History: Past Medical History:  Diagnosis Date   Candida vaginitis 07/08/2020   Medical history non-contributory    Normal labor 07/10/2020    Past Surgical History: Past Surgical History:  Procedure  Laterality Date   ESOPHAGEAL MANOMETRY N/A 03/07/2018   Procedure: ESOPHAGEAL MANOMETRY (EM);  Surgeon: Rollin Dover, MD;  Location: [facility];  Service: Endoscopy;  Laterality: N/A;   SINUS EXPLORATION  2011    Family History: Family History  Problem Relation Age of Onset   Cancer Paternal Grandmother        breast   Cancer Maternal Grandfather        brain    Social History: Social History   Tobacco Use  Smoking Status Never  Smokeless Tobacco Never   Social History   Substance and Sexual Activity  Alcohol Use Not Currently   Social History   Substance and Sexual Activity  Drug Use Never    Allergies: No Known Allergies  Medications: Current Outpatient Medications  Medication Sig Dispense Refill   acetaminophen  (TYLENOL ) 325 MG tablet Take 2 tablets (650 mg total) by mouth every 6 (six) hours as needed. 36 tablet 0   fluticasone  (FLONASE ) 50 MCG/ACT nasal spray Place 2 sprays into both nostrils daily. 16 g 1   levocetirizine (XYZAL ) 5 MG tablet Take 1 tablet (5 mg total) by mouth every evening. 30 tablet 5   ondansetron  (ZOFRAN ) 4 MG tablet Take 1 tablet (4 mg total) by mouth every 8 (eight) hours as needed for nausea or vomiting. 20 tablet 0   No current facility-administered medications for this visit.    Review of Systems: GENERAL: negative for malaise, night sweats HEENT: No changes in hearing or vision, no nose bleeds or other nasal problems. NECK: Negative for lumps, goiter, pain and significant neck swelling RESPIRATORY: Negative for cough, wheezing CARDIOVASCULAR: Negative for chest pain, leg swelling, palpitations, orthopnea GI: SEE HPI MUSCULOSKELETAL: Negative  for joint pain or swelling, back pain, and muscle pain. SKIN: Negative for lesions, rash HEMATOLOGY Negative for prolonged bleeding, bruising easily, and swollen nodes. ENDOCRINE: Negative for cold or heat intolerance, polyuria, polydipsia and goiter. NEURO: negative for tremor, gait  imbalance, syncope and seizures. The remainder of the review of systems is noncontributory.   Physical Exam: BP (!) 88/56   Pulse 69   Temp 98 F (36.7 C)   Ht 5' 3 (1.6 m)   Wt 109 lb 14.4 oz (49.9 kg)   LMP 08/28/2024 (Exact Date)   BMI 19.47 kg/m  GENERAL: The patient is AO x3, in no acute distress. HEENT: Head is normocephalic and atraumatic. EOMI are intact. Mouth is well hydrated and without lesions. NECK: Supple. No masses LUNGS: Clear to auscultation. No presence of rhonchi/wheezing/rales. Adequate chest expansion HEART: RRR, normal s1 and s2. ABDOMEN: Soft, mild epigastric tenderness, no guarding, no peritoneal signs, and nondistended. BS +. No masses. Negative Carnet sign     Imaging/Labs: as above     Latest Ref Rng & Units 11/22/2023    9:16 AM 07/19/2023    4:45 PM 09/19/2022   11:44 AM  CBC  WBC 4.0 - 10.5 K/uL 4.0 - 10.5 K/uL 9.4    9.4  5.6  13.4   Hemoglobin 12.0 - 15.0 g/dL 87.9 - 84.9 g/dL 86.4    86.2  85.4  85.3   Hematocrit 36.0 - 46.0 % 36.0 - 46.0 % 40.6    41.1  44.4  44.2   Platelets 150 - 400 K/uL 150 - 400 K/uL 203    198  203  234    No results found for: IRON , TIBC, FERRITIN  I personally reviewed and interpreted the available labs, imaging and endoscopic files.  CT abdomen pelvis IMPRESSION: 1. No acute findings in the abdomen or pelvis. Specifically, no findings to explain the patient's history of left upper quadrant pain. 2. Circumferential bronchial wall thickening in the right lower lobe with patchy and nodular airspace disease in the right lower lobe. Imaging features compatible with bronchitis and pneumonia.  Impression and Plan:  Susan Lara is a 28 y.o. female with no significant medical comorbidities who presents for evaluation of Chronic progressive abdominal pain  # Chronic progressive abdominal pain  Patient has chronic abdominal pain worsened postprandially this could be peptic ulcer disease or  gallbladder disease  Patient's symptoms have not improved with optimal PPI (omeprazole  )challenge and continues to have pain  Patient have limited her oral intake due to fear of precipitating pain and is losing weight  CT abdomen/pelvis last year was negative for similar reasons  Since patient has not improved with PPI challenge we will perform upper endoscopy to evaluate for any peptic ulcer disease.  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      Osinachi Navarrette Faizan Moon Budde, MD Gastroenterology and Hepatology [facility] Gastroenterology   This chart has been completed using Dragon Medical  Dictation software, and while attempts have been made to ensure accuracy , certain words and phrases may not be transcribed as intended

## 2024-08-29 ENCOUNTER — Encounter: Payer: Self-pay | Admitting: *Deleted

## 2024-08-31 NOTE — Telephone Encounter (Signed)
 LMTCB to schedule EGD any room, Dr. Cinderella

## 2024-09-05 ENCOUNTER — Encounter (HOSPITAL_COMMUNITY)
Admission: RE | Admit: 2024-09-05 | Discharge: 2024-09-05 | Disposition: A | Discharge status: Home / Self Care | Source: Ambulatory Visit | Attending: Gastroenterology | Admitting: Gastroenterology

## 2024-09-05 DIAGNOSIS — R1013 Epigastric pain: Secondary | ICD-10-CM | POA: Insufficient documentation

## 2024-09-05 DIAGNOSIS — R109 Unspecified abdominal pain: Secondary | ICD-10-CM | POA: Diagnosis not present

## 2024-09-05 MED ORDER — SINCALIDE 5 MCG IJ SOLR
INTRAMUSCULAR | Status: AC
  Filled 2024-09-05: qty 5

## 2024-09-05 MED ORDER — STERILE WATER FOR INJECTION IJ SOLN
INTRAMUSCULAR | Status: AC
  Filled 2024-09-05: qty 10

## 2024-09-05 MED ORDER — TECHNETIUM TC 99M MEBROFENIN IV KIT
5.0000 | PACK | Freq: Once | INTRAVENOUS | Status: AC | PRN
Start: 2024-09-05 — End: 2024-09-05
  Administered 2024-09-05: 5 via INTRAVENOUS

## 2024-09-06 ENCOUNTER — Ambulatory Visit (INDEPENDENT_AMBULATORY_CARE_PROVIDER_SITE_OTHER): Payer: Self-pay | Admitting: Gastroenterology

## 2024-09-06 ENCOUNTER — Ambulatory Visit (HOSPITAL_COMMUNITY)

## 2024-09-06 ENCOUNTER — Encounter (HOSPITAL_COMMUNITY): Payer: Self-pay

## 2024-09-06 NOTE — Progress Notes (Signed)
 Hi Tanya ,  Can you please call the patient and tell the patient the HIDA showed normal emptying of the gallbladder and did not show anything concerning . Would recommend upper endoscopy as discussed in clinic   Thanks,  Luvinia Lucy Faizan Aleksandra Raben, MD Gastroenterology and Hepatology [facility] Gastroenterology =========== NUCLEAR MEDICINE HEPATOBILIARY IMAGING WITH GALLBLADDER EF   IMPRESSION: Normal gallbladder ejection fraction.   Patent cystic duct.

## 2024-09-12 NOTE — Telephone Encounter (Signed)
 Spoke with patient, scheduled EGD for 10/11/2024 at 11:15am. Instructions sent to [facility]

## 2024-09-14 NOTE — Telephone Encounter (Signed)
 Patient called wanting to reschedule her EGD. I have rescheduled the patient to 10/12/2024 at 10:00. Sending updated instructions to mychart.

## 2024-09-26 ENCOUNTER — Institutional Professional Consult (permissible substitution) (INDEPENDENT_AMBULATORY_CARE_PROVIDER_SITE_OTHER): Admitting: Otolaryngology

## 2024-10-10 ENCOUNTER — Encounter (HOSPITAL_COMMUNITY): Payer: Self-pay

## 2024-10-10 ENCOUNTER — Encounter (HOSPITAL_COMMUNITY)
Admission: RE | Admit: 2024-10-10 | Discharge: 2024-10-10 | Disposition: A | Discharge status: Home / Self Care | Source: Ambulatory Visit | Attending: Gastroenterology | Admitting: Gastroenterology

## 2024-10-10 VITALS — Ht 62.0 in | Wt 110.0 lb

## 2024-10-10 DIAGNOSIS — Z01818 Encounter for other preprocedural examination: Secondary | ICD-10-CM

## 2024-10-12 ENCOUNTER — Encounter (HOSPITAL_COMMUNITY)
Admission: RE | Disposition: A | Discharge status: Home / Self Care | Payer: Self-pay | Source: Home / Self Care | Attending: Gastroenterology

## 2024-10-12 ENCOUNTER — Ambulatory Visit (HOSPITAL_COMMUNITY): Admitting: Anesthesiology

## 2024-10-12 ENCOUNTER — Emergency Department (HOSPITAL_BASED_OUTPATIENT_CLINIC_OR_DEPARTMENT_OTHER)

## 2024-10-12 ENCOUNTER — Emergency Department (HOSPITAL_BASED_OUTPATIENT_CLINIC_OR_DEPARTMENT_OTHER): Admission: EM | Admit: 2024-10-12 | Discharge: 2024-10-12 | Disposition: A | Discharge status: Home / Self Care

## 2024-10-12 ENCOUNTER — Other Ambulatory Visit: Payer: Self-pay

## 2024-10-12 ENCOUNTER — Emergency Department (HOSPITAL_BASED_OUTPATIENT_CLINIC_OR_DEPARTMENT_OTHER): Admitting: Radiology

## 2024-10-12 ENCOUNTER — Ambulatory Visit (HOSPITAL_COMMUNITY)
Admission: RE | Admit: 2024-10-12 | Discharge: 2024-10-12 | Disposition: A | Discharge status: Home / Self Care | Attending: Gastroenterology | Admitting: Gastroenterology

## 2024-10-12 DIAGNOSIS — I950 Idiopathic hypotension: Secondary | ICD-10-CM | POA: Insufficient documentation

## 2024-10-12 DIAGNOSIS — K3189 Other diseases of stomach and duodenum: Secondary | ICD-10-CM

## 2024-10-12 DIAGNOSIS — R1011 Right upper quadrant pain: Secondary | ICD-10-CM | POA: Insufficient documentation

## 2024-10-12 DIAGNOSIS — Z01818 Encounter for other preprocedural examination: Secondary | ICD-10-CM

## 2024-10-12 DIAGNOSIS — K449 Diaphragmatic hernia without obstruction or gangrene: Secondary | ICD-10-CM

## 2024-10-12 DIAGNOSIS — R1013 Epigastric pain: Secondary | ICD-10-CM | POA: Insufficient documentation

## 2024-10-12 DIAGNOSIS — I10 Essential (primary) hypertension: Secondary | ICD-10-CM | POA: Diagnosis not present

## 2024-10-12 DIAGNOSIS — Y92410 Unspecified street and highway as the place of occurrence of the external cause: Secondary | ICD-10-CM | POA: Diagnosis not present

## 2024-10-12 DIAGNOSIS — R1319 Other dysphagia: Secondary | ICD-10-CM

## 2024-10-12 DIAGNOSIS — M438X2 Other specified deforming dorsopathies, cervical region: Secondary | ICD-10-CM | POA: Diagnosis not present

## 2024-10-12 DIAGNOSIS — Z743 Need for continuous supervision: Secondary | ICD-10-CM | POA: Diagnosis not present

## 2024-10-12 DIAGNOSIS — M542 Cervicalgia: Secondary | ICD-10-CM | POA: Insufficient documentation

## 2024-10-12 DIAGNOSIS — R1310 Dysphagia, unspecified: Secondary | ICD-10-CM | POA: Insufficient documentation

## 2024-10-12 DIAGNOSIS — T1490XA Injury, unspecified, initial encounter: Secondary | ICD-10-CM | POA: Diagnosis not present

## 2024-10-12 DIAGNOSIS — R1084 Generalized abdominal pain: Secondary | ICD-10-CM | POA: Diagnosis not present

## 2024-10-12 DIAGNOSIS — K2970 Gastritis, unspecified, without bleeding: Secondary | ICD-10-CM

## 2024-10-12 HISTORY — PX: ESOPHAGOGASTRODUODENOSCOPY: SHX5428

## 2024-10-12 LAB — POCT PREGNANCY, URINE: Preg Test, Ur: NEGATIVE

## 2024-10-12 LAB — PREGNANCY, URINE: Preg Test, Ur: NEGATIVE

## 2024-10-12 SURGERY — EGD (ESOPHAGOGASTRODUODENOSCOPY)
Anesthesia: General

## 2024-10-12 MED ORDER — MIDAZOLAM HCL 2 MG/2ML IJ SOLN
INTRAMUSCULAR | Status: AC
Start: 2024-10-12 — End: 2024-10-12
  Filled 2024-10-12: qty 2

## 2024-10-12 MED ORDER — LACTATED RINGERS IV SOLN: INTRAVENOUS | Status: DC

## 2024-10-12 MED ORDER — PROPOFOL 10 MG/ML IV BOLUS
INTRAVENOUS | Status: DC | PRN
  Administered 2024-10-12: 100 mg via INTRAVENOUS
  Administered 2024-10-12 (×2): 50 mg via INTRAVENOUS

## 2024-10-12 MED ORDER — MIDAZOLAM HCL (PF) 2 MG/2ML IJ SOLN
INTRAMUSCULAR | Status: DC | PRN
  Administered 2024-10-12: 2 mg via INTRAVENOUS

## 2024-10-12 MED ORDER — LACTATED RINGERS IV SOLN: INTRAVENOUS | Status: DC | PRN

## 2024-10-12 MED ORDER — LIDOCAINE 2% (20 MG/ML) 5 ML SYRINGE
INTRAMUSCULAR | Status: DC | PRN
  Administered 2024-10-12: 60 mg via INTRAVENOUS

## 2024-10-12 MED ORDER — PANTOPRAZOLE SODIUM 40 MG PO TBEC: 40.0000 mg | DELAYED_RELEASE_TABLET | Freq: Every day | ORAL | 1 refills | Status: DC

## 2024-10-12 MED ORDER — CYCLOBENZAPRINE HCL 10 MG PO TABS: 10.0000 mg | ORAL_TABLET | Freq: Two times a day (BID) | ORAL | 0 refills | Status: DC | PRN

## 2024-10-12 MED ORDER — PHENYLEPHRINE 80 MCG/ML (10ML) SYRINGE FOR IV PUSH (FOR BLOOD PRESSURE SUPPORT)
PREFILLED_SYRINGE | INTRAVENOUS | Status: DC | PRN
  Administered 2024-10-12 (×2): 80 ug via INTRAVENOUS

## 2024-10-12 NOTE — Transfer of Care (Signed)
 Immediate Anesthesia Transfer of Care Note  Patient: Susan Lara  Procedure(s) Performed: EGD (ESOPHAGOGASTRODUODENOSCOPY)  Patient Location: Short Stay  Anesthesia Type:General  Level of Consciousness: awake, alert , oriented, and patient cooperative  Airway & Oxygen Therapy: Patient Spontanous Breathing  Post-op Assessment: Report given to RN, Post -op Vital signs reviewed and stable, and Patient moving all extremities X 4  Post vital signs: Reviewed and stable  Last Vitals:  Vitals Value Taken Time  BP    Temp    Pulse    Resp    SpO2      Last Pain:  Vitals:   10/12/24 1018  TempSrc:   PainSc: 0-No pain      Patients Stated Pain Goal: 6 (10/12/24 0913)  Complications: No notable events documented.

## 2024-10-12 NOTE — H&P (Signed)
 Primary Care Physician:  Tobie Suzzane POUR, MD Primary Gastroenterologist:  Dr. Cinderella  Pre-Procedure History & Physical: HPI:  Susan Lara is a 28 y.o. female with no significant medical comorbidities who presents for evaluation of Chronic progressive abdominal pain   Patient reports that for at least past 2 years she has abdominal pain located epigastric area worsens postprandially does not matter what type of food she eats solid or liquids epigastric pain radiating to the left side of her abdomen.  This has worsened recently and will keep her awake at night especially if she eats at night.  Patient developed fear of food at this time Patient was seen by PCP and PPI was optimized by increasing to twice daily.  Patient was also given Bentyl .  Patient had at least 2 CTs done for similar complaints of abdominal pain The patient denies having any nausea, vomiting, fever, chills, hematochezia, melena, hematemesis,  diarrhea, jaundice, pruritus or weight loss.   2018 patient had dysphagia where she reportedly underwent upper endoscopy followed by manometry which was negative.  Patient reported dysphagia resolved by itself and she has no difficulty swallowing for any    Labs 12/18/2022 normal liver enzymes hemoglobin 13.7 platelet 198   Last EGD:2018 ; no report avaible Last Colonoscopy:none   Esophageal manometry 2019 reportedly normal   FHx: neg for any gastrointestinal/liver disease, no malignancies Social: neg smoking, alcohol or illicit drug use Surgical: no abdominal surgeries  Past Medical History:  Diagnosis Date   Candida vaginitis 07/08/2020   Medical history non-contributory    Normal labor 07/10/2020    Past Surgical History:  Procedure Laterality Date   ESOPHAGEAL MANOMETRY N/A 03/07/2018   Procedure: ESOPHAGEAL MANOMETRY (EM);  Surgeon: Rollin Dover, MD;  Location: [facility];  Service: Endoscopy;  Laterality: N/A;   SINUS EXPLORATION  2011    Prior to Admission  medications   Medication Sig Start Date End Date Taking? Authorizing Provider  acetaminophen  (TYLENOL ) 325 MG tablet Take 2 tablets (650 mg total) by mouth every 6 (six) hours as needed. 11/22/23   Elnor Jayson LABOR, DO  fluticasone  (FLONASE ) 50 MCG/ACT nasal spray Place 2 sprays into both nostrils daily. 07/26/24   Tobie Suzzane POUR, MD  levocetirizine (XYZAL ) 5 MG tablet Take 1 tablet (5 mg total) by mouth every evening. 07/26/24   Tobie Suzzane POUR, MD  ondansetron  (ZOFRAN ) 4 MG tablet Take 1 tablet (4 mg total) by mouth every 8 (eight) hours as needed for nausea or vomiting. 04/27/24   Tobie Suzzane POUR, MD    Allergies as of 09/12/2024   (No Known Allergies)    Family History  Problem Relation Age of Onset   Cancer Paternal Grandmother        breast   Cancer Maternal Grandfather        brain    Social History   Socioeconomic History   Marital status: Single    Spouse name: Not on file   Number of children: Not on file   Years of education: Not on file   Highest education level: Associate degree: occupational, scientist, product/process development, or vocational program  Occupational History   Not on file  Tobacco Use   Smoking status: Never   Smokeless tobacco: Never  Vaping Use   Vaping status: Never Used  Substance and Sexual Activity   Alcohol use: Not Currently   Drug use: Never   Sexual activity: Yes    Birth control/protection: None  Other Topics Concern   Not on file  Social History Narrative   Not on file   Social Drivers of Health   Financial Resource Strain: Low Risk  (07/25/2024)   Overall Financial Resource Strain (CARDIA)    Difficulty of Paying Living Expenses: Not hard at all  Food Insecurity: No Food Insecurity (07/25/2024)   Hunger Vital Sign    Worried About Running Out of Food in the Last Year: Never true    Ran Out of Food in the Last Year: Never true  Transportation Needs: No Transportation Needs (07/25/2024)   PRAPARE - Administrator, Civil Service (Medical): No     Lack of Transportation (Non-Medical): No  Physical Activity: Insufficiently Active (07/25/2024)   Exercise Vital Sign    Days of Exercise per Week: 2 days    Minutes of Exercise per Session: 20 min  Stress: No Stress Concern Present (07/25/2024)   [employer] of Occupational Health - Occupational Stress Questionnaire    Feeling of Stress: Only a little  Social Connections: Unknown (07/25/2024)   Social Connection and Isolation Panel    Frequency of Communication with Friends and Family: Three times a week    Frequency of Social Gatherings with Friends and Family: Three times a week    Attends Religious Services: 1 to 4 times per year    Active Member of Clubs or Organizations: No    Attends Banker Meetings: Not on file    Marital Status: Patient declined  Intimate Partner Violence: Not At Risk (04/17/2020)   Humiliation, Afraid, Rape, and Kick questionnaire    Fear of Current or Ex-Partner: No    Emotionally Abused: No    Physically Abused: No    Sexually Abused: No    Review of Systems: See HPI, otherwise negative ROS  Physical Exam: Vital signs in last 24 hours: Pulse Rate:  [68] 68 (11/06 0913) Resp:  [12] 12 (11/06 0913) BP: (102)/(53) 102/53 (11/06 0913) SpO2:  [96 %] 96 % (11/06 0913) Weight:  [50 kg] 50 kg (11/06 0913)   General:   Alert,  Well-developed, well-nourished, pleasant and cooperative in NAD Head:  Normocephalic and atraumatic. Eyes:  Sclera clear, no icterus.   Conjunctiva pink. Ears:  Normal auditory acuity. Nose:  No deformity, discharge,  or lesions. Msk:  Symmetrical without gross deformities. Normal posture. Extremities:  Without clubbing or edema. Neurologic:  Alert and  oriented x4;  grossly normal neurologically. Skin:  Intact without significant lesions or rashes. Psych:  Alert and cooperative. Normal mood and affect.  Impression/Plan: Susan Lara is a 28 y.o. female with no significant medical comorbidities who  presents for evaluation of Chronic progressive abdominal pain/Dysphagia  Proceed with Upper endoscopy with biopsies/dilation  The risks of the procedure including infection, bleed, or perforation as well as benefits, limitations, alternatives and imponderables have been reviewed with the patient. Questions have been answered. All parties agreeable.

## 2024-10-12 NOTE — ED Notes (Signed)
Discharge instructions, follow up care, and prescription reviewed and explained, pt verbalized understanding.  
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
## 2024-10-12 NOTE — Discharge Instructions (Signed)

## 2024-10-12 NOTE — Op Note (Signed)
 [facility] Patient Name: Susan Lara Procedure Date: 10/12/2024 10:07 AM MRN: [id number] Date of Birth: 05/20/1996 Attending MD: Deatrice Dine , MD, [id number] CSN: [id number] Age: 28 Admit Type: Outpatient Procedure:                Upper GI endoscopy Indications:              Epigastric abdominal pain, Dysphagia Providers:                Deatrice Dine, MD, Harlene Lips, Bascom Blush Referring MD:              Medicines:                Monitored Anesthesia Care Complications:            No immediate complications. Estimated Blood Loss:     Estimated blood loss was minimal. Procedure:                Pre-Anesthesia Assessment:                           - Prior to the procedure, a History and Physical                            was performed, and patient medications and                            allergies were reviewed. The patient's tolerance of                            previous anesthesia was also reviewed. The risks                            and benefits of the procedure and the sedation                            options and risks were discussed with the patient.                            All questions were answered, and informed consent                            was obtained. Prior Anticoagulants: The patient has                            taken no anticoagulant or antiplatelet agents. ASA                            Grade Assessment: II - A patient with mild systemic                            disease. After reviewing the risks and benefits,                            the patient was deemed in satisfactory condition to  undergo the procedure.                           After obtaining informed consent, the endoscope was                            passed under direct vision. Throughout the                            procedure, the patient's blood pressure, pulse, and                            oxygen saturations were monitored  continuously. The                            [id number] ([id number])Leezm was introduced through the                            mouth, and advanced to the second part of duodenum.                            The upper GI endoscopy was accomplished without                            difficulty. The patient tolerated the procedure                            well. Scope In: 10:25:27 AM Scope Out: 10:33:46 AM Total Procedure Duration: 0 hours 8 minutes 19 seconds  Findings:      No endoscopic abnormality was evident in the esophagus to explain the       patient's complaint of dysphagia. It was decided, however, to proceed       with dilation of the upper third of the esophagus, of the middle third       of the esophagus and of the lower third of the esophagus. A TTS dilator       was passed through the scope. Dilation with a 15-16.5-18 mm balloon       dilator was performed to 18 mm. The dilation site was examined following       endoscope reinsertion and showed mild mucosal disruption. Biopsies were       obtained from the proximal and distal esophagus with cold forceps for       histology of suspected eosinophilic esophagitis.      A 2 cm hiatal hernia was present.      The gastroesophageal flap valve was visualized endoscopically and       classified as Hill Grade III (minimal fold, loose to endoscope, hiatal       hernia likely).      Mildly erythematous mucosa without bleeding was found in the gastric       antrum. This was biopsied with a cold forceps for histology.      The duodenal bulb and second portion of the duodenum were normal.       Biopsies were taken with a cold forceps for histology. Impression:               - No endoscopic esophageal  abnormality to explain                            patient's dysphagia. Esophagus dilated. Dilated.                           - 2 cm hiatal hernia.                           - Gastroesophageal flap valve classified as Hill                             Grade III (minimal fold, loose to endoscope, hiatal                            hernia likely).                           - Erythematous mucosa in the antrum. Biopsied.                           - Normal duodenal bulb and second portion of the                            duodenum. Biopsied.                           - Biopsies were taken with a cold forceps for                            evaluation of eosinophilic esophagitis. Moderate Sedation:      Per Anesthesia Care Recommendation:           - Patient has a contact number available for                            emergencies. The signs and symptoms of potential                            delayed complications were discussed with the                            patient. Return to normal activities tomorrow.                            Written discharge instructions were provided to the                            patient.                           - Resume previous diet.                           - Continue present medications.                           -  Await pathology results.                           -PPI daily                           -Obtain blood work which was ordered previoulsy Procedure Code(s):        --- Professional ---                           [phone], Esophagogastroduodenoscopy, flexible,                            transoral; with transendoscopic balloon dilation of                            esophagus (less than 30 mm diameter)                           43239, 59, Esophagogastroduodenoscopy, flexible,                            transoral; with biopsy, single or multiple Diagnosis Code(s):        --- Professional ---                           R13.10, Dysphagia, unspecified                           K44.9, Diaphragmatic hernia without obstruction or                            gangrene                           K31.89, Other diseases of stomach and duodenum                           R10.13, Epigastric pain CPT copyright  2022 American Medical Association. All rights reserved. The codes documented in this report are preliminary and upon coder review may  be revised to meet current compliance requirements. Deatrice Dine, MD Deatrice Dine, MD 10/12/2024 10:40:52 AM This report has been signed electronically. Number of Addenda: 0
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
## 2024-10-12 NOTE — Anesthesia Preprocedure Evaluation (Signed)
 Anesthesia Evaluation  Patient identified by MRN, date of birth, ID band Patient awake    Reviewed: Allergy & Precautions, H&P , NPO status , Patient's Chart, lab work & pertinent test results, reviewed documented beta blocker date and time   Airway Mallampati: II  TM Distance: >3 FB Neck ROM: full    Dental no notable dental hx. (+) Teeth Intact, Dental Advisory Given   Pulmonary neg pulmonary ROS   Pulmonary exam normal breath sounds clear to auscultation       Cardiovascular Exercise Tolerance: Good Normal cardiovascular exam Rhythm:regular Rate:Normal  Idiopathic hypotension   Neuro/Psych negative neurological ROS  negative psych ROS   GI/Hepatic Neg liver ROS,,,dysphagia   Endo/Other  negative endocrine ROS    Renal/GU negative Renal ROS  negative genitourinary   Musculoskeletal   Abdominal   Peds  Hematology negative hematology ROS (+)   Anesthesia Other Findings   Reproductive/Obstetrics negative OB ROS                              Anesthesia Physical Anesthesia Plan  ASA: 2  Anesthesia Plan: General   Post-op Pain Management: Minimal or no pain anticipated   Induction: Intravenous  PONV Risk Score and Plan: Propofol infusion  Airway Management Planned: Natural Airway and Nasal Cannula  Additional Equipment: None  Intra-op Plan:   Post-operative Plan:   Informed Consent: I have reviewed the patients History and Physical, chart, labs and discussed the procedure including the risks, benefits and alternatives for the proposed anesthesia with the patient or authorized representative who has indicated his/her understanding and acceptance.     Dental Advisory Given  Plan Discussed with: CRNA  Anesthesia Plan Comments:         Anesthesia Quick Evaluation

## 2024-10-12 NOTE — Anesthesia Postprocedure Evaluation (Signed)
 Anesthesia Post Note  Patient: Susan Lara  Procedure(s) Performed: EGD (ESOPHAGOGASTRODUODENOSCOPY)  Patient location during evaluation: Phase II Anesthesia Type: General Level of consciousness: awake and alert Pain management: pain level controlled Vital Signs Assessment: post-procedure vital signs reviewed and stable Respiratory status: spontaneous breathing, nonlabored ventilation and respiratory function stable Cardiovascular status: stable Anesthetic complications: no   There were no known notable events for this encounter.   Last Vitals:  Vitals:   10/12/24 1057 10/12/24 1101  BP: (!) 95/52 95/63  Pulse:    Resp:    Temp:    SpO2:      Last Pain:  Vitals:   10/12/24 1053  TempSrc: Oral  PainSc: 0-No pain                 Zaylan Kissoon L Tallan Sandoz
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
## 2024-10-13 ENCOUNTER — Encounter (HOSPITAL_COMMUNITY): Payer: Self-pay | Admitting: Gastroenterology

## 2024-10-13 LAB — SURGICAL PATHOLOGY

## 2024-10-17 ENCOUNTER — Ambulatory Visit (INDEPENDENT_AMBULATORY_CARE_PROVIDER_SITE_OTHER): Payer: Self-pay | Admitting: Gastroenterology

## 2024-10-17 ENCOUNTER — Institutional Professional Consult (permissible substitution) (INDEPENDENT_AMBULATORY_CARE_PROVIDER_SITE_OTHER): Admitting: Otolaryngology

## 2024-10-17 ENCOUNTER — Encounter: Payer: Self-pay | Admitting: Internal Medicine

## 2024-10-17 NOTE — Telephone Encounter (Signed)
 PT SCHEDULED ON 11/14 AT 9AM

## 2024-10-17 NOTE — Progress Notes (Deleted)
 Dear Dr. Tobie, Here is my assessment for our mutual patient, Susan Lara. Thank you for allowing me the opportunity to care for your patient. Please do not hesitate to contact me should you have any other questions. Sincerely, Dr. Eldora Tobie  Otolaryngology Clinic Note  HISTORY:  Initial visit (10/2024): Discussed the use of AI scribe software for clinical note transcription with the patient, who gave verbal consent to proceed.  History of Present Illness     She reports ***.  Current symptoms include ***.  She denies ***.  Symptoms began *** ago.  Symptom severity is ***.  Improvement occurred with ***.  Additional evaluation has included ***.  Allergy testing *** been done.  *** No previous sinonasal surgery.  She is currently using ***.  GLP-1: *** AP/AC: ***  Tobacco: *** Lives in ***  PMHx: Hypotension, Dysphagia,   RADIOGRAPHIC EVALUATION AND INDEPENDENT REVIEW OF OTHER RECORDS:: Dr. JONELLE Tobie (07/26/2024): nasal congestion, sinus pressure, headache, facial pain x10d; h/o sinusitis; Dx: Sinusitis: Rx: augmentin , flonase , PO anthistamine, ref to ENT Dr. JONELLE Tobie 04/27/2024: Dx: URTI; Rx: Augmentin  Susan Lara (04/25/2024): viral URI Dr. Tobie 03/23/2024: noted URI sx, prior antibiotics did not helpDx: Sinusitis; Rx: prendisone, flonase  Dr. Tobie 02/17/2024: URI Sx, Dx: Sinusitis; Rx: given augmentin .  Labs CBC and CMP 11/22/2023: WBC 9.4, Eos 100; BUN/Cr 10/0.91 CT Head 01/22/2021 independently interpreted with respect to sinuses: left frontal more hypoplastic; visualized paranasal sinuses clear except for left inferior max MPT EGD 2025: no stricture, hiatal hernia Past Medical History:  Diagnosis Date   Candida vaginitis 07/08/2020   Medical history non-contributory    Normal labor 07/10/2020   Past Surgical History:  Procedure Laterality Date   ESOPHAGEAL MANOMETRY N/A 03/07/2018   Procedure: ESOPHAGEAL MANOMETRY (EM);  Surgeon: Rollin Dover, MD;  Location: [facility]  ENDOSCOPY;  Service: Endoscopy;  Laterality: N/A;   ESOPHAGOGASTRODUODENOSCOPY N/A 10/12/2024   Procedure: EGD (ESOPHAGOGASTRODUODENOSCOPY);  Surgeon: Cinderella Deatrice FALCON, MD;  Location: [facility] ENDO [facility];  Service: Endoscopy;  Laterality: N/A;  11:15am, ASA 1-2   SINUS EXPLORATION  2011   Family History  Problem Relation Age of Onset   Cancer Paternal Grandmother        breast   Cancer Maternal Grandfather        brain   Social History   Tobacco Use   Smoking status: Never   Smokeless tobacco: Never  Substance Use Topics   Alcohol use: Not Currently   No Known Allergies Current Outpatient Medications  Medication Sig Dispense Refill   acetaminophen  (TYLENOL ) 325 MG tablet Take 2 tablets (650 mg total) by mouth every 6 (six) hours as needed. 36 tablet 0   cyclobenzaprine (FLEXERIL) 10 MG tablet Take 1 tablet (10 mg total) by mouth 2 (two) times daily as needed for muscle spasms. 14 tablet 0   fluticasone  (FLONASE ) 50 MCG/ACT nasal spray Place 2 sprays into both nostrils daily. 16 g 1   levocetirizine (XYZAL ) 5 MG tablet Take 1 tablet (5 mg total) by mouth every evening. 30 tablet 5   ondansetron  (ZOFRAN ) 4 MG tablet Take 1 tablet (4 mg total) by mouth every 8 (eight) hours as needed for nausea or vomiting. 20 tablet 0   pantoprazole  (PROTONIX ) 40 MG tablet Take 1 tablet (40 mg total) by mouth daily. 30 tablet 1   No current facility-administered medications for this visit.   LMP 10/04/2024 (Approximate)   PHYSICAL EXAM:  LMP 10/04/2024 (Approximate)    Salient findings:  CN II-XII intact ***  Bilateral EAC clear and TM intact with well pneumatized middle ear spaces Weber 512: Rinne 512: AC > BC b/l *** Rine 1024: AC > BC b/l *** Nose: Anterior rhinoscopy reveals ***.  Nasal endoscopy was indicated to better evaluate the nose and paranasal sinuses, given the patient's history and exam findings, and is detailed below. No lesions of oral cavity/oropharynx; dentition *** No obviously  palpable neck masses/lymphadenopathy/thyromegaly No respiratory distress or stridor***   PROCEDURE:  Prior to initiating any procedures, risks/benefits/alternatives were explained to the patient and verbal consent obtained. Diagnostic Nasal Endoscopy Pre-procedure diagnosis: Concern for *** Post-procedure diagnosis: same Indication: See pre-procedure diagnosis and physical exam above Complications: None apparent EBL: *** mL Anesthesia: Lidocaine  4% and topical decongestant was topically sprayed in each nasal cavity  Description of Procedure:  Patient was identified. A rigid 30 degree endoscope was utilized to evaluate the sinonasal cavities, mucosa, sinus ostia and turbinates and septum.  Overall, signs of mucosal inflammation are*** noted.  Also noted are ***.  No mucopurulence, polyps, or masses noted.   Right Middle meatus: *** Right SE Recess: *** Left MM: *** Left SE Recess: *** Photodocumentation was obtained.  CPT CODE -- 31231 - Mod 25   ASSESSMENT:  ***  No diagnosis found.   PLAN: We've discussed issues and options today.  We reviewed the nasal endoscopy images together.  The risks, benefits and alternatives were discussed and questions answered.  She has elected to proceed with:  1) 2) Follow-up in *** -- sooner as necessary.  See below regarding exact medications prescribed this encounter including dosages and route: No orders of the defined types were placed in this encounter.    Thank you for allowing me the opportunity to care for your patient. Please do not hesitate to contact me should you have any other questions.  Sincerely, Eldora Blanch, MD Otolaryngologist (ENT), [facility] Phone: [phone] Fax: [phone]  MDM:  Level ***: 99*** Complexity/Problems addressed: *** Data complexity: *** independent interpretation of *** - Morbidity: ***  - Prescription Drug prescribed or managed: ***  10/17/2024, 7:31 AM

## 2024-10-18 NOTE — Progress Notes (Signed)
 Patient result letter mailed Patient's PCP is on EPIC

## 2024-10-20 ENCOUNTER — Ambulatory Visit (INDEPENDENT_AMBULATORY_CARE_PROVIDER_SITE_OTHER): Admitting: Internal Medicine

## 2024-10-20 VITALS — BP 98/69 | HR 64 | Ht 63.0 in | Wt 112.2 lb

## 2024-10-20 DIAGNOSIS — M549 Dorsalgia, unspecified: Secondary | ICD-10-CM | POA: Diagnosis not present

## 2024-10-20 DIAGNOSIS — Z09 Encounter for follow-up examination after completed treatment for conditions other than malignant neoplasm: Secondary | ICD-10-CM

## 2024-10-20 DIAGNOSIS — S161XXD Strain of muscle, fascia and tendon at neck level, subsequent encounter: Secondary | ICD-10-CM | POA: Diagnosis not present

## 2024-10-20 DIAGNOSIS — K2950 Unspecified chronic gastritis without bleeding: Secondary | ICD-10-CM | POA: Diagnosis not present

## 2024-10-20 DIAGNOSIS — F3161 Bipolar disorder, current episode mixed, mild: Secondary | ICD-10-CM | POA: Diagnosis not present

## 2024-10-20 DIAGNOSIS — S161XXA Strain of muscle, fascia and tendon at neck level, initial encounter: Secondary | ICD-10-CM

## 2024-10-20 MED ORDER — CYCLOBENZAPRINE HCL 10 MG PO TABS: 10.0000 mg | ORAL_TABLET | Freq: Two times a day (BID) | ORAL | 0 refills | Status: AC | PRN

## 2024-10-20 MED ORDER — ARIPIPRAZOLE 10 MG PO TABS: 10.0000 mg | ORAL_TABLET | Freq: Every day | ORAL | 3 refills | Status: DC

## 2024-10-20 NOTE — Patient Instructions (Signed)
 Please start taking Abilify as prescribed.  Please take Flexeril as needed for muscle spasms.  Please start taking Pantoprazole  as prescribed.
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
## 2024-10-21 DIAGNOSIS — M549 Dorsalgia, unspecified: Secondary | ICD-10-CM | POA: Insufficient documentation

## 2024-10-21 DIAGNOSIS — S161XXA Strain of muscle, fascia and tendon at neck level, initial encounter: Secondary | ICD-10-CM | POA: Insufficient documentation

## 2024-10-21 DIAGNOSIS — Z09 Encounter for follow-up examination after completed treatment for conditions other than malignant neoplasm: Secondary | ICD-10-CM | POA: Insufficient documentation

## 2024-10-21 NOTE — Assessment & Plan Note (Signed)
 Current neck pain is likely due to muscular strain CT cervical spine did not show any acute abnormality Advised to take Tylenol  as needed for pain Flexeril as needed for muscle spasms Heating pad as needed for local relief

## 2024-10-21 NOTE — Assessment & Plan Note (Addendum)
 ER chart reviewed, including imaging Her current neck and mid back pain is likely due to muscular strain

## 2024-10-21 NOTE — Assessment & Plan Note (Signed)
 Since 10/12/24 Likely muscular strain, no local bruising Tylenol  as needed for pain Flexeril as needed for muscle stiffness/spasms

## 2024-10-21 NOTE — Assessment & Plan Note (Signed)
 Has chronic epigastric pain, likely related to chronic gastritis/GERD Referred to GI for further evaluation - had EGD, showed erythematous mucosa in the antrum Needs to start taking pantoprazole  40 mg QD

## 2024-10-21 NOTE — Assessment & Plan Note (Signed)
 Likely has hypomania at times Has a history of bipolar disorder, but has lost follow-up with psychiatry now Had responded well to Abilify in the past Will start Abilify 10 mg QD for now If inadequate response, will refer to psychiatry

## 2024-10-27 ENCOUNTER — Other Ambulatory Visit: Payer: Self-pay | Admitting: Internal Medicine

## 2024-10-27 DIAGNOSIS — S161XXD Strain of muscle, fascia and tendon at neck level, subsequent encounter: Secondary | ICD-10-CM

## 2024-11-04 ENCOUNTER — Other Ambulatory Visit (INDEPENDENT_AMBULATORY_CARE_PROVIDER_SITE_OTHER): Payer: Self-pay | Admitting: Gastroenterology

## 2024-11-11 ENCOUNTER — Other Ambulatory Visit: Payer: Self-pay | Admitting: Internal Medicine

## 2024-11-11 DIAGNOSIS — F3161 Bipolar disorder, current episode mixed, mild: Secondary | ICD-10-CM

## 2024-11-14 NOTE — Addendum Note (Signed)
 Addended byBETHA TOBIE DOWNS on: 11/14/2024 10:00 AM   Modules accepted: Orders

## 2024-11-20 ENCOUNTER — Ambulatory Visit: Attending: Internal Medicine | Admitting: Physical Therapy

## 2024-11-20 NOTE — Therapy (Incomplete)
 OUTPATIENT PHYSICAL THERAPY CERVICAL EVALUATION   Patient Name: Susan Lara MRN: [id number] DOB:05-26-1996, 28 y.o., female Today's Date: 11/20/2024  END OF SESSION:   Past Medical History:  Diagnosis Date   Candida vaginitis 07/08/2020   Medical history non-contributory    Normal labor 07/10/2020   Past Surgical History:  Procedure Laterality Date   ESOPHAGEAL MANOMETRY N/A 03/07/2018   Procedure: ESOPHAGEAL MANOMETRY (EM);  Surgeon: Rollin Dover, MD;  Location: [facility];  Service: Endoscopy;  Laterality: N/A;   ESOPHAGOGASTRODUODENOSCOPY N/A 10/12/2024   Procedure: EGD (ESOPHAGOGASTRODUODENOSCOPY);  Surgeon: Cinderella Deatrice FALCON, MD;  Location: [facility];  Service: Endoscopy;  Laterality: N/A;  11:15am, ASA 1-2   SINUS EXPLORATION  2011   Patient Active Problem List   Diagnosis Date Noted   Cervical strain 10/21/2024   Encounter for examination following treatment at hospital 10/21/2024   Mid back pain 10/21/2024   Bipolar disorder, current episode mixed, mild (HCC) 10/20/2024   Gastritis and gastroduodenitis 10/12/2024   Idiopathic hypotension 08/28/2024   Chronic gastritis without bleeding 08/14/2024   Impacted cerumen of right ear 06/06/2024   URTI (acute upper respiratory infection) 04/27/2024   Allergic sinusitis 03/23/2024   Acute recurrent frontal sinusitis 02/17/2024   Close exposure to COVID-19 virus 02/02/2023   LLQ abdominal pain 02/02/2023   RLQ abdominal pain 08/26/2022   Nausea 08/26/2022   Cystitis 08/07/2022   Cough 06/18/2022   Encounter for general adult medical examination with abnormal findings 05/30/2022   Dysphagia 02/11/2021   Head injury 01/22/2021    PCP: ***  REFERRING PROVIDER: Lewayne Suzzane MARLA, MD  REFERRING DIAG: ***S16.1XXD (ICD-10-CM) - Strain of neck muscle, subsequent encounter  THERAPY DIAG:  No diagnosis found.  Rationale for Evaluation and Treatment: Rehabilitation  ONSET DATE: 11/14/2024 (referral  date)  SUBJECTIVE:                                                                                                                                                                                                         SUBJECTIVE STATEMENT: *** Hand dominance: {MISC; OT HAND DOMINANCE:[phone]}  PERTINENT HISTORY:  *** past medical history of GERD, allergic sinusitis and bipolar disorder  PAIN:  Are you having pain? {OPRCPAIN:27236}  PRECAUTIONS: {Therapy precautions:24002}  RED FLAGS: {PT Red Flags:29287}     WEIGHT BEARING RESTRICTIONS: {Yes ***/No:24003}  FALLS:  Has patient fallen in last 6 months? {fallsyesno:27318}  LIVING ENVIRONMENT: Lives with: {OPRC lives with:25569::lives with their family} Lives in: {Lives in:25570} Stairs: {opstairs:27293} Has following equipment at home: {Assistive devices:23999}  OCCUPATION: ***  PLOF: {PLOF:24004}  PATIENT GOALS: ***  NEXT MD VISIT: ***  OBJECTIVE:  Note: Objective measures were completed at Evaluation unless otherwise noted.  DIAGNOSTIC FINDINGS:  Cervical Spine CT 10/12/2024 FINDINGS: Alignment: There is mild left convex curvature of the cervical spine which may be positional. Otherwise alignment is anatomic.   Skull base and vertebrae: No acute fracture. No primary bone lesion or focal pathologic process.   Soft tissues and spinal canal: No prevertebral fluid or swelling. No visible canal hematoma.   Disc levels:  No significant spondylosis or facet hypertrophy.   Upper chest: Airway is patent. Visualized portions of the lung apices are clear.   Other: Reconstructed images demonstrate no additional findings.   IMPRESSION: 1. No acute cervical spine fracture.  PATIENT SURVEYS:  {rehab surveys:24030}  COGNITION: Overall cognitive status: {cognition:24006}  SENSATION: {sensation:27233}  POSTURE: {posture:25561}  PALPATION: ***   CERVICAL ROM:   {AROM/PROM:27142} ROM A/PROM (deg) eval   Flexion   Extension   Right lateral flexion   Left lateral flexion   Right rotation   Left rotation    (Blank rows = not tested)  UPPER EXTREMITY ROM:  {AROM/PROM:27142} ROM Right eval Left eval  Shoulder flexion    Shoulder extension    Shoulder abduction    Shoulder adduction    Shoulder extension    Shoulder internal rotation    Shoulder external rotation    Elbow flexion    Elbow extension    Wrist flexion    Wrist extension    Wrist ulnar deviation    Wrist radial deviation    Wrist pronation    Wrist supination     (Blank rows = not tested)  UPPER EXTREMITY MMT:  MMT Right eval Left eval  Shoulder flexion    Shoulder extension    Shoulder abduction    Shoulder adduction    Shoulder extension    Shoulder internal rotation    Shoulder external rotation    Middle trapezius    Lower trapezius    Elbow flexion    Elbow extension    Wrist flexion    Wrist extension    Wrist ulnar deviation    Wrist radial deviation    Wrist pronation    Wrist supination    Grip strength     (Blank rows = not tested)  CERVICAL SPECIAL TESTS:  {Cervical special tests:25246}  FUNCTIONAL TESTS:  {Functional tests:24029}  TREATMENT DATE: ***                                                                                                                                 PATIENT EDUCATION:  Education details: *** Person educated: {Person educated:25204} Education method: {Education Method:25205} Education comprehension: {Education Comprehension:25206}  HOME EXERCISE PROGRAM: ***  ASSESSMENT:  CLINICAL IMPRESSION: Patient is a *** year old *** referred to Neuro OPPT for***.   Pt's PMH is significant for: *** The following deficits were present  during the exam: ***. Based on ***, pt is an incr risk for falls. Pt would benefit from skilled PT to address these impairments and functional limitations to maximize functional mobility independence   OBJECTIVE  IMPAIRMENTS: {opptimpairments:25111}.   ACTIVITY LIMITATIONS: {activitylimitations:27494}  PARTICIPATION LIMITATIONS: {participationrestrictions:25113}  PERSONAL FACTORS: {Personal factors:25162} are also affecting patient's functional outcome.   REHAB POTENTIAL: {rehabpotential:25112}  CLINICAL DECISION MAKING: {clinical decision making:25114}  EVALUATION COMPLEXITY: {Evaluation complexity:25115}   GOALS: Goals reviewed with patient? {yes/no:20286}  SHORT TERM GOALS: Target date: ***  *** Baseline:  Goal status: INITIAL  2.  *** Baseline:  Goal status: INITIAL  3.  *** Baseline:  Goal status: INITIAL  4.  *** Baseline:  Goal status: INITIAL  5.  *** Baseline:  Goal status: INITIAL  6.  *** Baseline:  Goal status: INITIAL  LONG TERM GOALS: Target date: ***  *** Baseline:  Goal status: INITIAL  2.  *** Baseline:  Goal status: INITIAL  3.  *** Baseline:  Goal status: INITIAL  4.  *** Baseline:  Goal status: INITIAL  5.  *** Baseline:  Goal status: INITIAL  6.  *** Baseline:  Goal status: INITIAL   PLAN:  PT FREQUENCY: {rehab frequency:25116}  PT DURATION: {rehab duration:25117}  PLANNED INTERVENTIONS: {rehab planned interventions:25118::97110-Therapeutic exercises,97530- Therapeutic [phone]- Neuromuscular re-education,97535- Self Rjmz,02859- Manual therapy,Patient/Family education}  PLAN FOR NEXT SESSION: ***   Waddell Southgate, PT Waddell Southgate, PT, DPT, CSRS  11/20/2024, 7:33 AM

## 2024-11-22 ENCOUNTER — Encounter: Payer: Self-pay | Admitting: Internal Medicine

## 2024-11-27 ENCOUNTER — Institutional Professional Consult (permissible substitution) (INDEPENDENT_AMBULATORY_CARE_PROVIDER_SITE_OTHER): Admitting: Otolaryngology

## 2024-11-27 ENCOUNTER — Ambulatory Visit: Payer: Self-pay

## 2024-11-27 DIAGNOSIS — Z111 Encounter for screening for respiratory tuberculosis: Secondary | ICD-10-CM | POA: Diagnosis not present

## 2024-11-27 NOTE — Progress Notes (Signed)
 Patient is in office today for a nurse visit for PPD. Patient Injection was given in the  Right arm. Patient tolerated injection well.

## 2024-11-29 LAB — TB SKIN TEST
Induration: 0 mm
TB Skin Test: NEGATIVE

## 2024-11-29 NOTE — Progress Notes (Signed)
PPD Results Negative

## 2025-02-19 ENCOUNTER — Ambulatory Visit: Admitting: Internal Medicine
